# Patient Record
Sex: Male | Born: 1975 | Race: White | Hispanic: No | Marital: Single | Smoking: Former smoker
Health system: Southern US, Community
[De-identification: ages and names within clinical notes are randomized; demographics above are authoritative.]

## PROBLEM LIST (undated history)

## (undated) DIAGNOSIS — T148XXA Other injury of unspecified body region, initial encounter: Secondary | ICD-10-CM

## (undated) DIAGNOSIS — I509 Heart failure, unspecified: Secondary | ICD-10-CM

## (undated) DIAGNOSIS — I499 Cardiac arrhythmia, unspecified: Secondary | ICD-10-CM

## (undated) DIAGNOSIS — I10 Essential (primary) hypertension: Secondary | ICD-10-CM

## (undated) DIAGNOSIS — I469 Cardiac arrest, cause unspecified: Secondary | ICD-10-CM

## (undated) DIAGNOSIS — F1010 Alcohol abuse, uncomplicated: Secondary | ICD-10-CM

## (undated) HISTORY — DX: Essential (primary) hypertension: I10

## (undated) HISTORY — DX: Cardiac arrhythmia, unspecified: I49.9

## (undated) HISTORY — DX: Heart failure, unspecified: I50.9

## (undated) HISTORY — PX: FRACTURE SURGERY: SHX138

---

## 2019-03-30 ENCOUNTER — Ambulatory Visit: Payer: BLUE CROSS/BLUE SHIELD | Attending: Internal Medicine

## 2019-03-30 DIAGNOSIS — Z23 Encounter for immunization: Secondary | ICD-10-CM

## 2019-03-30 NOTE — Progress Notes (Signed)
   Covid-19 Vaccination Clinic  Name:  Samuel [name]    MRN: [id number] DOB: 08-May-1975  03/30/2019  [name] was observed post Covid-19 immunization for 15 minutes without incident. He was provided with Vaccine Information Sheet and instruction to access the V-Safe system.   [name] was instructed to call 911 with any severe reactions post vaccine: Marland Kitchen Difficulty breathing  . Swelling of face and throat  . A fast heartbeat  . A bad rash all over body  . Dizziness and weakness   Immunizations Administered    Name Date Dose VIS Date Route   Pfizer COVID-19 Vaccine 03/30/2019 11:45 AM 0.3 mL 12/29/2018 Intramuscular   Manufacturer: [employer], [employer]   Lot: [id number]   NDC: [address]-4

## 2019-04-25 ENCOUNTER — Ambulatory Visit: Payer: BLUE CROSS/BLUE SHIELD | Attending: Internal Medicine

## 2019-04-25 DIAGNOSIS — Z23 Encounter for immunization: Secondary | ICD-10-CM

## 2019-04-25 NOTE — Progress Notes (Signed)
   Covid-19 Vaccination Clinic  Name:  Samuel [name]    MRN: [id number] DOB: [birth year]  04/25/2019  [name] was observed post Covid-19 immunization for 15 minutes without incident. He was provided with Vaccine Information Sheet and instruction to access the V-Safe system.   [name] was instructed to call 911 with any severe reactions post vaccine: Marland Kitchen Difficulty breathing  . Swelling of face and throat  . A fast heartbeat  . A bad rash all over body  . Dizziness and weakness   Immunizations Administered    Name Date Dose VIS Date Route   Pfizer COVID-19 Vaccine 04/25/2019  2:45 PM 0.3 mL 12/29/2018 Intramuscular   Manufacturer: [employer], [employer]   Lot: [id number]   NDC: [address]-7

## 2020-05-13 ENCOUNTER — Ambulatory Visit: Payer: Self-pay

## 2020-05-13 ENCOUNTER — Telehealth: Payer: BLUE CROSS/BLUE SHIELD | Admitting: Nurse Practitioner

## 2020-05-13 DIAGNOSIS — J219 Acute bronchiolitis, unspecified: Secondary | ICD-10-CM

## 2020-05-13 MED ORDER — AZITHROMYCIN 250 MG PO TABS: ORAL_TABLET | ORAL | 0 refills | Status: DC

## 2020-05-13 MED ORDER — BENZONATATE 100 MG PO CAPS: 100.0000 mg | ORAL_CAPSULE | Freq: Three times a day (TID) | ORAL | 0 refills | Status: DC | PRN

## 2020-05-13 NOTE — Progress Notes (Signed)
We are sorry that you are not feeling well.  Here is how we plan to help!  Based on your presentation I believe you most likely have A cough due to bacteria.  When patients have a fever and a productive cough with a change in color or increased sputum production, we are concerned about bacterial bronchitis.  If left untreated it can progress to pneumonia.  If your symptoms do not improve with your treatment plan it is important that you contact your provider.   I have prescribed Azithromyin 250 mg: two tablets now and then one tablet daily for 4 additonal days    In addition you may use A prescription cough medication called Tessalon Perles 100mg . You may take 1-2 capsules every 8 hours as needed for your cough.    From your responses in the eVisit questionnaire you describe inflammation in the upper respiratory tract which is causing a significant cough.  This is commonly called Bronchitis and has four common causes:    Allergies  Viral Infections  Acid Reflux  Bacterial Infection Allergies, viruses and acid reflux are treated by controlling symptoms or eliminating the cause.  Another example might be a cough caused by acid reflux. Controlling the reflux helps control the cough.  As we discussed the number for setting up a primary care doctor is:  [phone] You can also make an appointment by visiting their website [facility]  It is very important to establish with a primary care doctor and to have regular physical exams. If you have any difficulty setting up an appointment please let [facility] know.   HOME CARE . Only take medications as instructed by your medical team. . Complete the entire course of an antibiotic. . Drink plenty of fluids and get plenty of rest. . Avoid close contacts especially the very young and the elderly . Cover your mouth if you cough or cough into your sleeve. . Always remember to wash your hands . A steam or ultrasonic humidifier can  help congestion.   GET HELP RIGHT AWAY IF: . You develop worsening fever. . You become short of breath . You cough up blood. . Your symptoms persist after you have completed your treatment plan MAKE SURE YOU   Understand these instructions.  Will watch your condition.  Will get help right away if you are not doing well or get worse.  Your e-visit answers were reviewed by a board certified advanced clinical practitioner to complete your personal care plan.  Depending on the condition, your plan could have included both over the counter or prescription medications. If there is a problem please reply  once you have received a response from your provider. Your safety is important to [place].  If you have drug allergies check your prescription carefully.    You can use MyChart to ask questions about today's visit, request a non-urgent call back, or ask for a work or school excuse for 24 hours related to this e-Visit. If it has been greater than 24 hours you will need to follow up with your provider, or enter a new e-Visit to address those concerns. You will get an e-mail in the next two days asking about your experience.  I hope that your e-visit has been valuable and will speed your recovery. Thank you for using e-visits.   10 minutes were spent reviewing patient's symptoms, spoke with patient over the phone to gather additional information and to assure stability of patient. No acute distress  was present during phone conversation with provider. Patient denied SOB, and was instructed to seek medical attention with any new/worsening symptoms.   Meds ordered this encounter  Medications  . azithromycin (ZITHROMAX) 250 MG tablet    Sig: Take 2 tablets on day 1, then 1 tablet daily on days 2 through 5    Dispense:  6 tablet    Refill:  0  . benzonatate (TESSALON) 100 MG capsule    Sig: Take 1 capsule (100 mg total) by mouth 3 (three) times daily as needed for cough.    Dispense:  30 capsule     Refill:  0

## 2020-05-15 ENCOUNTER — Ambulatory Visit: Payer: Self-pay

## 2020-05-16 ENCOUNTER — Emergency Department: Payer: BLUE CROSS/BLUE SHIELD

## 2020-05-16 ENCOUNTER — Inpatient Hospital Stay
Admission: EM | Admit: 2020-05-16 | Discharge: 2020-05-22 | DRG: 286 | Disposition: A | Discharge status: Home / Self Care | Payer: BLUE CROSS/BLUE SHIELD | Attending: Internal Medicine | Admitting: Internal Medicine

## 2020-05-16 ENCOUNTER — Other Ambulatory Visit: Payer: Self-pay

## 2020-05-16 DIAGNOSIS — I5021 Acute systolic (congestive) heart failure: Secondary | ICD-10-CM | POA: Diagnosis not present

## 2020-05-16 DIAGNOSIS — I2510 Atherosclerotic heart disease of native coronary artery without angina pectoris: Secondary | ICD-10-CM | POA: Diagnosis not present

## 2020-05-16 DIAGNOSIS — G8929 Other chronic pain: Secondary | ICD-10-CM | POA: Diagnosis present

## 2020-05-16 DIAGNOSIS — Z791 Long term (current) use of non-steroidal anti-inflammatories (NSAID): Secondary | ICD-10-CM | POA: Diagnosis not present

## 2020-05-16 DIAGNOSIS — Z87891 Personal history of nicotine dependence: Secondary | ICD-10-CM | POA: Diagnosis not present

## 2020-05-16 DIAGNOSIS — F1020 Alcohol dependence, uncomplicated: Secondary | ICD-10-CM

## 2020-05-16 DIAGNOSIS — Z79899 Other long term (current) drug therapy: Secondary | ICD-10-CM | POA: Diagnosis not present

## 2020-05-16 DIAGNOSIS — Z809 Family history of malignant neoplasm, unspecified: Secondary | ICD-10-CM | POA: Diagnosis not present

## 2020-05-16 DIAGNOSIS — I110 Hypertensive heart disease with heart failure: Secondary | ICD-10-CM | POA: Diagnosis present

## 2020-05-16 DIAGNOSIS — E739 Lactose intolerance, unspecified: Secondary | ICD-10-CM | POA: Diagnosis present

## 2020-05-16 DIAGNOSIS — Z720 Tobacco use: Secondary | ICD-10-CM | POA: Diagnosis not present

## 2020-05-16 DIAGNOSIS — Z8674 Personal history of sudden cardiac arrest: Secondary | ICD-10-CM | POA: Diagnosis not present

## 2020-05-16 DIAGNOSIS — I4891 Unspecified atrial fibrillation: Secondary | ICD-10-CM | POA: Diagnosis not present

## 2020-05-16 DIAGNOSIS — I160 Hypertensive urgency: Secondary | ICD-10-CM | POA: Diagnosis present

## 2020-05-16 DIAGNOSIS — K760 Fatty (change of) liver, not elsewhere classified: Secondary | ICD-10-CM | POA: Diagnosis present

## 2020-05-16 DIAGNOSIS — I4819 Other persistent atrial fibrillation: Principal | ICD-10-CM | POA: Diagnosis present

## 2020-05-16 DIAGNOSIS — K8020 Calculus of gallbladder without cholecystitis without obstruction: Secondary | ICD-10-CM | POA: Diagnosis present

## 2020-05-16 DIAGNOSIS — I420 Dilated cardiomyopathy: Secondary | ICD-10-CM | POA: Diagnosis not present

## 2020-05-16 DIAGNOSIS — F1210 Cannabis abuse, uncomplicated: Secondary | ICD-10-CM | POA: Diagnosis present

## 2020-05-16 DIAGNOSIS — Z8249 Family history of ischemic heart disease and other diseases of the circulatory system: Secondary | ICD-10-CM

## 2020-05-16 DIAGNOSIS — D72829 Elevated white blood cell count, unspecified: Secondary | ICD-10-CM | POA: Diagnosis present

## 2020-05-16 DIAGNOSIS — Z83438 Family history of other disorder of lipoprotein metabolism and other lipidemia: Secondary | ICD-10-CM | POA: Diagnosis not present

## 2020-05-16 DIAGNOSIS — I428 Other cardiomyopathies: Secondary | ICD-10-CM | POA: Diagnosis present

## 2020-05-16 DIAGNOSIS — Z6841 Body Mass Index (BMI) 40.0 and over, adult: Secondary | ICD-10-CM | POA: Diagnosis not present

## 2020-05-16 DIAGNOSIS — Z20822 Contact with and (suspected) exposure to covid-19: Secondary | ICD-10-CM | POA: Diagnosis present

## 2020-05-16 DIAGNOSIS — I429 Cardiomyopathy, unspecified: Secondary | ICD-10-CM

## 2020-05-16 DIAGNOSIS — R1013 Epigastric pain: Secondary | ICD-10-CM | POA: Diagnosis not present

## 2020-05-16 DIAGNOSIS — R7401 Elevation of levels of liver transaminase levels: Secondary | ICD-10-CM | POA: Diagnosis present

## 2020-05-16 DIAGNOSIS — I340 Nonrheumatic mitral (valve) insufficiency: Secondary | ICD-10-CM | POA: Diagnosis present

## 2020-05-16 DIAGNOSIS — I25111 Atherosclerotic heart disease of native coronary artery with angina pectoris with documented spasm: Secondary | ICD-10-CM | POA: Diagnosis present

## 2020-05-16 DIAGNOSIS — I493 Ventricular premature depolarization: Secondary | ICD-10-CM | POA: Diagnosis present

## 2020-05-16 HISTORY — DX: Cardiac arrest, cause unspecified: I46.9

## 2020-05-16 HISTORY — DX: Morbid (severe) obesity due to excess calories: E66.01

## 2020-05-16 HISTORY — DX: Alcohol abuse, uncomplicated: F10.10

## 2020-05-16 HISTORY — DX: Other injury of unspecified body region, initial encounter: T14.8XXA

## 2020-05-16 LAB — COMPREHENSIVE METABOLIC PANEL
ALT: 119 U/L — ABNORMAL HIGH (ref 0–44)
AST: 60 U/L — ABNORMAL HIGH (ref 15–41)
Albumin: 4.6 g/dL (ref 3.5–5.0)
Alkaline Phosphatase: 57 U/L (ref 38–126)
Anion gap: 11 (ref 5–15)
BUN: 17 mg/dL (ref 6–20)
CO2: 22 mmol/L (ref 22–32)
Calcium: 9 mg/dL (ref 8.9–10.3)
Chloride: 106 mmol/L (ref 98–111)
Creatinine, Ser: 1.11 mg/dL (ref 0.61–1.24)
GFR, Estimated: >60 mL/min (ref >60)
Glucose, Bld: 114 mg/dL — ABNORMAL HIGH (ref 70–99)
Potassium: 3.8 mmol/L (ref 3.5–5.1)
Sodium: 139 mmol/L (ref 135–145)
Total Bilirubin: 1.7 mg/dL — ABNORMAL HIGH (ref 0.3–1.2)
Total Protein: 8 g/dL (ref 6.5–8.1)

## 2020-05-16 LAB — TROPONIN I (HIGH SENSITIVITY): Troponin I (High Sensitivity): 16 ng/L (ref <18)

## 2020-05-16 LAB — URINALYSIS, COMPLETE (UACMP) WITH MICROSCOPIC
Bacteria, UA: NONE SEEN
Bilirubin Urine: NEGATIVE
Glucose, UA: NEGATIVE mg/dL
Ketones, ur: 5 mg/dL — AB
Leukocytes,Ua: NEGATIVE
Nitrite: NEGATIVE
Protein, ur: NEGATIVE mg/dL
Specific Gravity, Urine: 1.028 (ref 1.005–1.030)
Squamous Epithelial / HPF: NONE SEEN (ref 0–5)
pH: 5 (ref 5.0–8.0)

## 2020-05-16 LAB — PROTIME-INR
INR: 1.1 (ref 0.8–1.2)
Prothrombin Time: 14.1 seconds (ref 11.4–15.2)

## 2020-05-16 LAB — CBC
HCT: 43.1 % (ref 39.0–52.0)
Hemoglobin: 14.6 g/dL (ref 13.0–17.0)
MCH: 32.4 pg (ref 26.0–34.0)
MCHC: 33.9 g/dL (ref 30.0–36.0)
MCV: 95.8 fL (ref 80.0–100.0)
Platelets: 351 10*3/uL (ref 150–400)
RBC: 4.5 MIL/uL (ref 4.22–5.81)
RDW: 13.2 % (ref 11.5–15.5)
WBC: 12.6 10*3/uL — ABNORMAL HIGH (ref 4.0–10.5)
nRBC: 0 % (ref 0.0–0.2)

## 2020-05-16 LAB — LIPASE, BLOOD: Lipase: 28 U/L (ref 11–51)

## 2020-05-16 LAB — APTT: aPTT: 34 seconds (ref 24–36)

## 2020-05-16 LAB — RESP PANEL BY RT-PCR (FLU A&B, COVID) ARPGX2
Influenza A by PCR: NEGATIVE
Influenza B by PCR: NEGATIVE
SARS Coronavirus 2 by RT PCR: NEGATIVE

## 2020-05-16 MED ORDER — ACETAMINOPHEN 325 MG PO TABS: 650.0000 mg | ORAL_TABLET | Freq: Four times a day (QID) | ORAL | Status: DC | PRN

## 2020-05-16 MED ORDER — PANTOPRAZOLE SODIUM 40 MG PO TBEC
40.0000 mg | DELAYED_RELEASE_TABLET | Freq: Every day | ORAL | Status: DC
  Administered 2020-05-17 – 2020-05-22 (×7): 40 mg via ORAL
  Filled 2020-05-16 (×7): qty 1

## 2020-05-16 MED ORDER — DILTIAZEM HCL 25 MG/5ML IV SOLN
10.0000 mg | Freq: Once | INTRAVENOUS | Status: AC
  Administered 2020-05-16: 10 mg via INTRAVENOUS
  Filled 2020-05-16: qty 5

## 2020-05-16 MED ORDER — DILTIAZEM HCL-DEXTROSE 125-5 MG/125ML-% IV SOLN (PREMIX)
5.0000 mg/h | INTRAVENOUS | Status: DC
Start: 2020-05-16 — End: 2020-05-16
  Administered 2020-05-16: 5 mg/h via INTRAVENOUS
  Filled 2020-05-16: qty 125

## 2020-05-16 MED ORDER — SODIUM CHLORIDE 0.9 % IV BOLUS
1000.0000 mL | Freq: Once | INTRAVENOUS | Status: AC
  Administered 2020-05-16: 1000 mL via INTRAVENOUS

## 2020-05-16 MED ORDER — HEPARIN (PORCINE) 25000 UT/250ML-% IV SOLN
1500.0000 [IU]/h | INTRAVENOUS | Status: DC
  Filled 2020-05-16: qty 250

## 2020-05-16 MED ORDER — SUCRALFATE 1 G PO TABS
1.0000 g | ORAL_TABLET | Freq: Three times a day (TID) | ORAL | Status: DC
  Administered 2020-05-17 – 2020-05-22 (×18): 1 g via ORAL
  Filled 2020-05-16 (×18): qty 1

## 2020-05-16 MED ORDER — DILTIAZEM HCL-DEXTROSE 125-5 MG/125ML-% IV SOLN (PREMIX)
5.0000 mg/h | INTRAVENOUS | Status: DC
  Administered 2020-05-17 – 2020-05-18 (×4): 15 mg/h via INTRAVENOUS
  Filled 2020-05-16 (×4): qty 125

## 2020-05-16 MED ORDER — ONDANSETRON HCL 4 MG/2ML IJ SOLN: 4.0000 mg | Freq: Four times a day (QID) | INTRAMUSCULAR | Status: DC | PRN

## 2020-05-16 MED ORDER — TRAMADOL HCL 50 MG PO TABS
50.0000 mg | ORAL_TABLET | Freq: Four times a day (QID) | ORAL | Status: DC | PRN
Start: 2020-05-16 — End: 2020-05-22
  Filled 2020-05-16: qty 1

## 2020-05-16 MED ORDER — IOHEXOL 300 MG/ML  SOLN
125.0000 mL | Freq: Once | INTRAMUSCULAR | Status: AC | PRN
  Administered 2020-05-16: 125 mL via INTRAVENOUS

## 2020-05-16 MED ORDER — ENOXAPARIN SODIUM 80 MG/0.8ML IJ SOSY
0.5000 mg/kg | PREFILLED_SYRINGE | INTRAMUSCULAR | Status: DC
  Administered 2020-05-17: 70 mg via SUBCUTANEOUS
  Filled 2020-05-16: qty 0.7

## 2020-05-16 MED ORDER — ACETAMINOPHEN 325 MG PO TABS: 650.0000 mg | ORAL_TABLET | ORAL | Status: DC | PRN

## 2020-05-16 MED ORDER — ENOXAPARIN SODIUM 40 MG/0.4ML IJ SOSY: 40.0000 mg | PREFILLED_SYRINGE | INTRAMUSCULAR | Status: DC

## 2020-05-16 MED ORDER — HEPARIN SODIUM (PORCINE) 5000 UNIT/ML IJ SOLN: 4000.0000 [IU] | Freq: Once | INTRAMUSCULAR | Status: DC

## 2020-05-16 MED ORDER — IOHEXOL 9 MG/ML PO SOLN
500.0000 mL | ORAL | Status: AC
  Administered 2020-05-16: 500 mL via ORAL

## 2020-05-16 MED ORDER — MORPHINE SULFATE (PF) 2 MG/ML IV SOLN: 2.0000 mg | INTRAVENOUS | Status: DC | PRN

## 2020-05-16 MED ORDER — HEPARIN BOLUS VIA INFUSION
4000.0000 [IU] | Freq: Once | INTRAVENOUS | Status: DC
  Filled 2020-05-16: qty 4000
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
## 2020-05-16 NOTE — ED Triage Notes (Signed)
Pt c/o epigastric pain with SOB and diarrhea for the past week, states he went to GI specialist today and was referred to the ED for systolic pressure of 170. Pt is in NAD at present.

## 2020-05-16 NOTE — ED Notes (Signed)
Pt back from CT, HR remains 150s-160s, MD Paduchowski made aware via secure chat. Awaiting CT results. Report given to [facility]: Lambert Rhonda W An, Charity fundraiser.

## 2020-05-16 NOTE — Progress Notes (Signed)
PHARMACIST - PHYSICIAN COMMUNICATION  CONCERNING:  Enoxaparin (Lovenox) for DVT Prophylaxis    RECOMMENDATION: Patient was prescribed enoxaprin 40mg  q24 hours for VTE prophylaxis.   Filed Weights   05/16/20 1800  Weight: (!) 137.9 kg (304 lb)    Body mass index is 42.4 kg/m.  Estimated Creatinine Clearance: 119.2 mL/min (by C-G formula based on SCr of 1.11 mg/dL).   Based on [facility] policy patient is candidate for enoxaparin 0.5mg /kg TBW SQ every 24 hours based on BMI being >30.   DESCRIPTION: Pharmacy has adjusted enoxaparin dose per [facility] policy.  Patient is now receiving enoxaparin 70 mg every 24 hours    [facility], PharmD Clinical Pharmacist  05/16/2020 9:00 PM
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
## 2020-05-16 NOTE — ED Notes (Signed)
Assumed care of pt, pt in afib RVR. Reports chest pressure but able to talk in full sentences with regular and unlabored breathing. Denies lightheaded or dizziness. PO contrast finished and CT at bedside to transport to imaging. Pt father at bedside. AO x4. Awaiting pharmacy to tube heparin as pyxis is out. Pt aware of plan of care.

## 2020-05-17 ENCOUNTER — Inpatient Hospital Stay
Admit: 2020-05-17 | Discharge: 2020-05-17 | Disposition: A | Discharge status: Home / Self Care | Payer: BLUE CROSS/BLUE SHIELD | Attending: Internal Medicine | Admitting: Internal Medicine

## 2020-05-17 ENCOUNTER — Encounter: Payer: Self-pay | Admitting: Internal Medicine

## 2020-05-17 DIAGNOSIS — I429 Cardiomyopathy, unspecified: Secondary | ICD-10-CM

## 2020-05-17 DIAGNOSIS — I4891 Unspecified atrial fibrillation: Secondary | ICD-10-CM | POA: Diagnosis not present

## 2020-05-17 DIAGNOSIS — I5021 Acute systolic (congestive) heart failure: Secondary | ICD-10-CM | POA: Diagnosis not present

## 2020-05-17 DIAGNOSIS — Z720 Tobacco use: Secondary | ICD-10-CM

## 2020-05-17 DIAGNOSIS — R1013 Epigastric pain: Secondary | ICD-10-CM

## 2020-05-17 DIAGNOSIS — F1020 Alcohol dependence, uncomplicated: Secondary | ICD-10-CM | POA: Diagnosis not present

## 2020-05-17 LAB — HEPARIN LEVEL (UNFRACTIONATED): Heparin Unfractionated: 0.12 IU/mL — ABNORMAL LOW (ref 0.30–0.70)

## 2020-05-17 LAB — ECHOCARDIOGRAM COMPLETE
AR max vel: 2.5 cm2
AV Peak grad: 5.7 mmHg
Ao pk vel: 1.19 m/s
Height: 71 in
S' Lateral: 5.45 cm
Weight: 4864 oz

## 2020-05-17 LAB — MAGNESIUM: Magnesium: 2.3 mg/dL (ref 1.7–2.4)

## 2020-05-17 MED ORDER — HEPARIN (PORCINE) 25000 UT/250ML-% IV SOLN
2800.0000 [IU]/h | INTRAVENOUS | Status: DC
  Administered 2020-05-17: 1800 [IU]/h via INTRAVENOUS
  Administered 2020-05-17: 1500 [IU]/h via INTRAVENOUS
  Administered 2020-05-18 (×2): 2400 [IU]/h via INTRAVENOUS
  Administered 2020-05-19 – 2020-05-20 (×3): 2800 [IU]/h via INTRAVENOUS
  Filled 2020-05-17 (×7): qty 250

## 2020-05-17 MED ORDER — METOPROLOL TARTRATE 25 MG PO TABS
25.0000 mg | ORAL_TABLET | Freq: Two times a day (BID) | ORAL | Status: DC
  Administered 2020-05-17: 25 mg via ORAL
  Filled 2020-05-17: qty 1

## 2020-05-17 MED ORDER — HEPARIN BOLUS VIA INFUSION
4000.0000 [IU] | Freq: Once | INTRAVENOUS | Status: AC
  Administered 2020-05-17: 4000 [IU] via INTRAVENOUS
  Filled 2020-05-17: qty 4000

## 2020-05-17 MED ORDER — HEPARIN BOLUS VIA INFUSION
3200.0000 [IU] | Freq: Once | INTRAVENOUS | Status: AC
  Administered 2020-05-17: 3200 [IU] via INTRAVENOUS
  Filled 2020-05-17: qty 3200

## 2020-05-17 MED ORDER — FUROSEMIDE 10 MG/ML IJ SOLN
60.0000 mg | Freq: Two times a day (BID) | INTRAMUSCULAR | Status: DC
  Administered 2020-05-17 – 2020-05-18 (×2): 60 mg via INTRAVENOUS
  Filled 2020-05-17: qty 8
  Filled 2020-05-17: qty 6

## 2020-05-17 MED ORDER — METOPROLOL TARTRATE 25 MG PO TABS
25.0000 mg | ORAL_TABLET | Freq: Four times a day (QID) | ORAL | Status: DC
  Administered 2020-05-17 – 2020-05-18 (×3): 25 mg via ORAL
  Filled 2020-05-17 (×3): qty 1
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
## 2020-05-17 NOTE — Progress Notes (Addendum)
Progress Note    Samuel [name]  ZOX:[id number] DOB: [birth year]  DOA: 05/16/2020 PCP: Pcp, No      Brief Narrative:    Medical records reviewed and are as summarized below:  Samuel [name] is a 45 y.o. male with medical history significant for morbid obesity, alcohol use disorder, was referred from the gastroenterology office because of rapid heart rate and irregular heart rhythm.  He complained of epigastric pain, abdominal distention, palpitations and shortness of breath.  He was found to have hypertension and atrial fibrillation with RVR with heart rate in the 160s.      Assessment/Plan:   Principal Problem:   Rapid atrial fibrillation, new onset (HCC) Active Problems:   Alcohol use disorder, moderate, dependence (HCC)   Obesity, Class III, BMI 40-49.9 (morbid obesity) (HCC)   Cholelithiasis without obstruction   Epigastric pain   NSAID long-term use   Body mass index is 42.4 kg/m.  (Morbid obesity)   Atrial fibrillation with RVR: Taper off IV Cardizem infusion.  Continue IV heparin infusion.  Monitor heparin level per protocol.  Plan for TEE and cardioversion.  Follow-up with cardiologist.  Cardiomyopathy: Specifics unknown.  2D echo showed EF of 25%.  Alcohol and tobacco use disorder: Advised to quit drinking alcohol and to stop smoking cigarettes.  Hypertension: Discussed the need for antihypertensives on a long-term basis.  Cholelithiasis, hepatic steatosis, elevated liver enzymes: Outpatient follow-up   Diet Order            Diet regular Room service appropriate? Yes; Fluid consistency: Thin  Diet effective now                    Consultants:  Cardiologist  Procedures:  None    Medications:   . furosemide  60 mg Intravenous BID  . metoprolol tartrate  25 mg Oral Q6H  . pantoprazole  40 mg Oral Daily  . sucralfate  1 g Oral TID WC & HS   Continuous Infusions: . diltiazem (CARDIZEM) infusion 15 mg/hr  (05/17/20 1544)  . heparin 1,500 Units/hr (05/17/20 1034)     Anti-infectives (From admission, onward)   None             Family Communication/Anticipated D/C date and plan/Code Status   DVT prophylaxis:      Code Status: Full Code  Family Communication: Father at the bedside Disposition Plan:    Status is: Inpatient  Remains inpatient appropriate because:IV treatments appropriate due to intensity of illness or inability to take PO   Dispo: The patient is from: Home              Anticipated d/c is to: Home              Patient currently is not medically stable to d/c.   Difficult to place patient No           Subjective:   C/o lower mid chest pain/epigastric pain.  Breathing is a little better.  Objective:    Vitals:   05/17/20 1130 05/17/20 1327 05/17/20 1330 05/17/20 1544  BP: (!) 141/83 (!) 155/110 (!) 119/104 129/83  Pulse: 68 (!) 113 (!) 53 97  Resp: (!) 30  (!) 23 (!) 21  Temp:    97.8 F (36.6 C)  TempSrc:      SpO2: 96%  96% 93%  Weight:      Height:       No data found.   Intake/Output  Summary (Last 24 hours) at 05/17/2020 1630 Last data filed at 05/17/2020 1039 Gross per 24 hour  Intake 1000 ml  Output 800 ml  Net 200 ml   Filed Weights   05/16/20 1800  Weight: (!) 137.9 kg    Exam:  GEN: NAD SKIN: No rash EYES: EOMI ENT: MMM CV: Irregular rate and rhythm, tachycardic PULM: CTA B ABD: soft, obese, NT, +BS CNS: AAO x 3, non focal EXT: No edema or tenderness        Data Reviewed:   I have personally reviewed following labs and imaging studies:  Labs: Labs show the following:   Basic Metabolic Panel: Recent Labs  Lab 05/16/20 1522  NA 139  K 3.8  CL 106  CO2 22  GLUCOSE 114*  BUN 17  CREATININE 1.11  CALCIUM 9.0   GFR Estimated Creatinine Clearance: 119.2 mL/min (by C-G formula based on SCr of 1.11 mg/dL). Liver Function Tests: Recent Labs  Lab 05/16/20 1522  AST 60*  ALT 119*  ALKPHOS  57  BILITOT 1.7*  PROT 8.0  ALBUMIN 4.6   Recent Labs  Lab 05/16/20 1522  LIPASE 28   No results for input(s): AMMONIA in the last 168 hours. Coagulation profile Recent Labs  Lab 05/16/20 1826  INR 1.1    CBC: Recent Labs  Lab 05/16/20 1522  WBC 12.6*  HGB 14.6  HCT 43.1  MCV 95.8  PLT 351   Cardiac Enzymes: No results for input(s): CKTOTAL, CKMB, CKMBINDEX, TROPONINI in the last 168 hours. BNP (last 3 results) No results for input(s): PROBNP in the last 8760 hours. CBG: No results for input(s): GLUCAP in the last 168 hours. D-Dimer: No results for input(s): DDIMER in the last 72 hours. Hgb A1c: No results for input(s): HGBA1C in the last 72 hours. Lipid Profile: No results for input(s): CHOL, HDL, LDLCALC, TRIG, CHOLHDL, LDLDIRECT in the last 72 hours. Thyroid function studies: No results for input(s): TSH, T4TOTAL, T3FREE, THYROIDAB in the last 72 hours.  Invalid input(s): FREET3 Anemia work up: No results for input(s): VITAMINB12, FOLATE, FERRITIN, TIBC, IRON, RETICCTPCT in the last 72 hours. Sepsis Labs: Recent Labs  Lab 05/16/20 1522  WBC 12.6*    Microbiology Recent Results (from the past 240 hour(s))  Resp Panel by RT-PCR (Flu A&B, Covid) Nasopharyngeal Swab     Status: None   Collection Time: 05/16/20  6:26 PM   Specimen: Nasopharyngeal Swab; Nasopharyngeal(NP) swabs in vial transport medium  Result Value Ref Range Status   SARS Coronavirus 2 by RT PCR NEGATIVE NEGATIVE Final    Comment: (NOTE) SARS-CoV-2 target nucleic acids are NOT DETECTED.  The SARS-CoV-2 RNA is generally detectable in upper respiratory specimens during the acute phase of infection. The lowest concentration of SARS-CoV-2 viral copies this assay can detect is 138 copies/mL. A negative result does not preclude SARS-Cov-2 infection and should not be used as the sole basis for treatment or other patient management decisions. A negative result may occur with  improper  specimen collection/handling, submission of specimen other than nasopharyngeal swab, presence of viral mutation(s) within the areas targeted by this assay, and inadequate number of viral copies(<138 copies/mL). A negative result must be combined with clinical observations, patient history, and epidemiological information. The expected result is Negative.  Fact Sheet for Patients:  BloggerCourse.com  Fact Sheet for Healthcare Providers:  SeriousBroker.it  This test is no t yet approved or cleared by the [place] FDA and  has been authorized for detection and/or  diagnosis of SARS-CoV-2 by FDA under an Emergency Use Authorization (EUA). This EUA will remain  in effect (meaning this test can be used) for the duration of the COVID-19 declaration under Section 564(b)(1) of the Act, 21 U.S.C.section 360bbb-3(b)(1), unless the authorization is terminated  or revoked sooner.       Influenza A by PCR NEGATIVE NEGATIVE Final   Influenza B by PCR NEGATIVE NEGATIVE Final    Comment: (NOTE) The Xpert Xpress SARS-CoV-2/FLU/RSV plus assay is intended as an aid in the diagnosis of influenza from Nasopharyngeal swab specimens and should not be used as a sole basis for treatment. Nasal washings and aspirates are unacceptable for Xpert Xpress SARS-CoV-2/FLU/RSV testing.  Fact Sheet for Patients: BloggerCourse.com  Fact Sheet for Healthcare Providers: SeriousBroker.it  This test is not yet approved or cleared by the [place] FDA and has been authorized for detection and/or diagnosis of SARS-CoV-2 by FDA under an Emergency Use Authorization (EUA). This EUA will remain in effect (meaning this test can be used) for the duration of the COVID-19 declaration under Section 564(b)(1) of the Act, 21 U.S.C. section 360bbb-3(b)(1), unless the authorization is terminated or revoked.  Performed at  [facility], [address] [place], [place] [address]     Procedures and diagnostic studies:  CT ABDOMEN PELVIS W CONTRAST  Result Date: 05/16/2020 CLINICAL DATA:  Nonlocalized acute abdominal pain. EXAM: CT ABDOMEN AND PELVIS WITH CONTRAST TECHNIQUE: Multidetector CT imaging of the abdomen and pelvis was performed using the standard protocol following bolus administration of intravenous contrast. CONTRAST:  OMNIPAQUE IOHEXOL 300 MG/ML  SOLN COMPARISON:  None. FINDINGS: Lower chest: Trace right pleural effusion. Hepatobiliary: The hepatic parenchyma is diffusely mildly hypodense compared to the splenic parenchyma consistent with fatty infiltration. Calcified gallstone noted within the gallbladder lumen. No gallbladder wall thickening or pericholecystic fluid. No biliary dilatation. Pancreas: Diffusely atrophic. No focal lesion. Otherwise normal pancreatic contour. No surrounding inflammatory changes. No main pancreatic ductal dilatation. Spleen: Normal in size without focal abnormality. Adrenals/Urinary Tract: No adrenal nodule bilaterally. Bilateral kidneys enhance symmetrically. No hydronephrosis. No hydroureter. The urinary bladder is unremarkable. On delayed imaging, there is no urothelial wall thickening and there are no filling defects in the opacified portions of the bilateral collecting systems or ureters. Stomach/Bowel: PO contrast reaches the transverse colon. Stomach is within normal limits. No evidence of bowel wall thickening or dilatation. Appendix appears normal. Vascular/Lymphatic: No abdominal aorta or iliac aneurysm. Mild atherosclerotic plaque of the aorta and its branches. No abdominal, pelvic, or inguinal lymphadenopathy. Reproductive: Prostate is unremarkable. Other: No intraperitoneal free fluid. No intraperitoneal free gas. No organized fluid collection. Musculoskeletal: No acute or significant osseous findings. IMPRESSION: 1. Trace right pleural effusion 2.  Cholelithiasis with no CT findings of acute cholecystitis tightest choledocholithiasis. 3. Mild hepatic steatosis. 4.  Aortic Atherosclerosis (ICD10-I70.0). Electronically Signed   By: Tish Frederickson M.D.   On: 05/16/2020 19:50   DG Chest Portable 1 View  Result Date: 05/16/2020 CLINICAL DATA:  Shortness of breath. EXAM: PORTABLE CHEST 1 VIEW COMPARISON:  None. FINDINGS: The heart size appears to be generous. There are prominent interstitial lung markings bilaterally with blunting of the costophrenic angles bilaterally. There are streaky bibasilar airspace opacities suggestive of atelectasis. There is no pneumothorax. There is an old healed right clavicle fracture. IMPRESSION: Cardiomegaly with findings consistent with volume overload and possible developing interstitial edema. An atypical infectious process seems less likely but is not entirely excluded. Electronically Signed   By: Cristal Deer  Green M.D.   On: 05/16/2020 18:28   ECHOCARDIOGRAM COMPLETE  Result Date: 05/17/2020    ECHOCARDIOGRAM REPORT   Patient Name:   Samuel Maidens [name] Date of Exam: 05/17/2020 Medical Rec #:  [id number]                 Height:       71.0 in Accession #:    [id number]                Weight:       304.0 lb Date of Birth:  07/07/1975                 BSA:          2.519 m Patient Age:    44 years                  BP:           144/115 mmHg Patient Gender: M                         HR:           71 bpm. Exam Location:  [facility] Procedure: 2D Echo, Cardiac Doppler and Color Doppler Indications:     Atrial Fibrillation I48.91  History:         Patient has no prior history of Echocardiogram examinations.  Sonographer:     Neysa Bonito Roar Referring Phys:  [id number] Andris Baumann Diagnosing Phys: Marcina Millard MD IMPRESSIONS  1. Left ventricular ejection fraction, by estimation, is 25 to 30%. The left ventricle has severely decreased function. The left ventricle has no regional wall motion abnormalities. The left ventricular  internal cavity size was mildly to moderately dilated. Left ventricular diastolic parameters are indeterminate.  2. Right ventricular systolic function is normal. The right ventricular size is normal.  3. The mitral valve is normal in structure. Mild to moderate mitral valve regurgitation. No evidence of mitral stenosis.  4. The aortic valve is normal in structure. Aortic valve regurgitation is not visualized. No aortic stenosis is present.  5. The inferior vena cava is normal in size with greater than 50% respiratory variability, suggesting right atrial pressure of 3 mmHg. FINDINGS  Left Ventricle: Left ventricular ejection fraction, by estimation, is 25 to 30%. The left ventricle has severely decreased function. The left ventricle has no regional wall motion abnormalities. The left ventricular internal cavity size was mildly to moderately dilated. There is no left ventricular hypertrophy. Left ventricular diastolic parameters are indeterminate. Right Ventricle: The right ventricular size is normal. No increase in right ventricular wall thickness. Right ventricular systolic function is normal. Left Atrium: Left atrial size was normal in size. Right Atrium: Right atrial size was normal in size. Pericardium: There is no evidence of pericardial effusion. Mitral Valve: The mitral valve is normal in structure. Mild to moderate mitral valve regurgitation. No evidence of mitral valve stenosis. Tricuspid Valve: The tricuspid valve is normal in structure. Tricuspid valve regurgitation is mild . No evidence of tricuspid stenosis. Aortic Valve: The aortic valve is normal in structure. Aortic valve regurgitation is not visualized. No aortic stenosis is present. Aortic valve peak gradient measures 5.7 mmHg. Pulmonic Valve: The pulmonic valve was normal in structure. Pulmonic valve regurgitation is not visualized. No evidence of pulmonic stenosis. Aorta: The aortic root is normal in size and structure. Venous: The inferior vena  cava is normal in size with greater than 50% respiratory variability,  suggesting right atrial pressure of 3 mmHg. IAS/Shunts: No atrial level shunt detected by color flow Doppler.  LEFT VENTRICLE PLAX 2D LVIDd:         6.03 cm  Diastology LVIDs:         5.45 cm  LV e' medial:  7.40 cm/s LV PW:         1.14 cm  LV e' lateral: 8.92 cm/s LV IVS:        0.99 cm LVOT diam:     2.00 cm LVOT Area:     3.14 cm  RIGHT VENTRICLE RV Mid diam:    2.68 cm RV S prime:     12.40 cm/s TAPSE (M-mode): 1.6 cm LEFT ATRIUM              Index       RIGHT ATRIUM           Index LA diam:        5.00 cm  1.98 cm/m  RA Area:     13.50 cm LA Vol (A2C):   85.3 ml  33.86 ml/m RA Volume:   27.40 ml  10.88 ml/m LA Vol (A4C):   115.0 ml 45.65 ml/m LA Biplane Vol: 102.0 ml 40.49 ml/m  AORTIC VALVE                PULMONIC VALVE AV Area (Vmax): 2.50 cm    PV Vmax:       0.95 m/s AV Vmax:        119.00 cm/s PV Peak grad:  3.6 mmHg AV Peak Grad:   5.7 mmHg LVOT Vmax:      94.70 cm/s  AORTA Ao Root diam: 2.70 cm TRICUSPID VALVE TR Peak grad:   20.2 mmHg TR Vmax:        225.00 cm/s  SHUNTS Systemic Diam: 2.00 cm Marcina Millard MD Electronically signed by Marcina Millard MD Signature Date/Time: 05/17/2020/2:27:44 PM    Final                LOS: 1 day   Alvenia Treese  [facility]   Pager on [email or web address]. If 7PM-7AM, please contact night-coverage at [email or web address]     05/17/2020, 4:30 PM
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
## 2020-05-17 NOTE — Progress Notes (Signed)
*  PRELIMINARY RESULTS* Echocardiogram 2D Echocardiogram has been performed.  Neita Garnet Tichina Koebel 05/17/2020, 2:04 PM

## 2020-05-17 NOTE — ED Notes (Signed)
Pt sleeping in NAD, on cardiac monitor, still on cardizem drip at 15 mg/hr, BP 122/76

## 2020-05-18 ENCOUNTER — Inpatient Hospital Stay
Admit: 2020-05-18 | Discharge: 2020-05-18 | Disposition: A | Discharge status: Home / Self Care | Payer: BLUE CROSS/BLUE SHIELD | Attending: Nurse Practitioner | Admitting: Nurse Practitioner

## 2020-05-18 DIAGNOSIS — I4891 Unspecified atrial fibrillation: Secondary | ICD-10-CM | POA: Diagnosis not present

## 2020-05-18 DIAGNOSIS — F1020 Alcohol dependence, uncomplicated: Secondary | ICD-10-CM | POA: Diagnosis not present

## 2020-05-18 DIAGNOSIS — I429 Cardiomyopathy, unspecified: Secondary | ICD-10-CM

## 2020-05-18 LAB — COMPREHENSIVE METABOLIC PANEL
ALT: 104 U/L — ABNORMAL HIGH (ref 0–44)
AST: 46 U/L — ABNORMAL HIGH (ref 15–41)
Albumin: 4.1 g/dL (ref 3.5–5.0)
Alkaline Phosphatase: 47 U/L (ref 38–126)
Anion gap: 10 (ref 5–15)
BUN: 18 mg/dL (ref 6–20)
CO2: 24 mmol/L (ref 22–32)
Calcium: 9 mg/dL (ref 8.9–10.3)
Chloride: 105 mmol/L (ref 98–111)
Creatinine, Ser: 1.07 mg/dL (ref 0.61–1.24)
GFR, Estimated: >60 mL/min (ref >60)
Glucose, Bld: 130 mg/dL — ABNORMAL HIGH (ref 70–99)
Potassium: 3.8 mmol/L (ref 3.5–5.1)
Sodium: 139 mmol/L (ref 135–145)
Total Bilirubin: 1.1 mg/dL (ref 0.3–1.2)
Total Protein: 7.4 g/dL (ref 6.5–8.1)

## 2020-05-18 LAB — CBC
HCT: 41.6 % (ref 39.0–52.0)
Hemoglobin: 13.9 g/dL (ref 13.0–17.0)
MCH: 32.5 pg (ref 26.0–34.0)
MCHC: 33.4 g/dL (ref 30.0–36.0)
MCV: 97.2 fL (ref 80.0–100.0)
Platelets: 350 10*3/uL (ref 150–400)
RBC: 4.28 MIL/uL (ref 4.22–5.81)
RDW: 13.2 % (ref 11.5–15.5)
WBC: 11.5 10*3/uL — ABNORMAL HIGH (ref 4.0–10.5)
nRBC: 0 % (ref 0.0–0.2)

## 2020-05-18 LAB — HEPARIN LEVEL (UNFRACTIONATED)
Heparin Unfractionated: 0.13 IU/mL — ABNORMAL LOW (ref 0.30–0.70)
Heparin Unfractionated: 0.26 IU/mL — ABNORMAL LOW (ref 0.30–0.70)
Heparin Unfractionated: 0.3 IU/mL (ref 0.30–0.70)

## 2020-05-18 LAB — ECHOCARDIOGRAM LIMITED
Calc EF: 30.7 %
Height: 71 in
S' Lateral: 5.45 cm
Single Plane A2C EF: 31.6 %
Single Plane A4C EF: 34 %
Weight: 4835.2 oz

## 2020-05-18 LAB — MAGNESIUM: Magnesium: 2.2 mg/dL (ref 1.7–2.4)

## 2020-05-18 MED ORDER — METOPROLOL TARTRATE 50 MG PO TABS
50.0000 mg | ORAL_TABLET | Freq: Four times a day (QID) | ORAL | Status: DC
  Administered 2020-05-18 – 2020-05-19 (×3): 50 mg via ORAL
  Filled 2020-05-18 (×4): qty 1

## 2020-05-18 MED ORDER — HEPARIN BOLUS VIA INFUSION
1500.0000 [IU] | Freq: Once | INTRAVENOUS | Status: AC
  Administered 2020-05-18: 1500 [IU] via INTRAVENOUS
  Filled 2020-05-18: qty 1500

## 2020-05-18 MED ORDER — FUROSEMIDE 10 MG/ML IJ SOLN
60.0000 mg | Freq: Three times a day (TID) | INTRAMUSCULAR | Status: DC
  Administered 2020-05-18 – 2020-05-19 (×5): 60 mg via INTRAVENOUS
  Filled 2020-05-18 (×5): qty 6

## 2020-05-18 MED ORDER — DIGOXIN 125 MCG PO TABS
0.1250 mg | ORAL_TABLET | Freq: Every day | ORAL | Status: DC
  Administered 2020-05-19 – 2020-05-22 (×4): 0.125 mg via ORAL
  Filled 2020-05-18 (×5): qty 1

## 2020-05-18 MED ORDER — HEPARIN BOLUS VIA INFUSION
3000.0000 [IU] | Freq: Once | INTRAVENOUS | Status: AC
  Administered 2020-05-18: 3000 [IU] via INTRAVENOUS
  Filled 2020-05-18: qty 3000

## 2020-05-18 MED ORDER — SACUBITRIL-VALSARTAN 24-26 MG PO TABS
1.0000 | ORAL_TABLET | Freq: Two times a day (BID) | ORAL | Status: DC
  Administered 2020-05-18 – 2020-05-22 (×9): 1 via ORAL
  Filled 2020-05-18 (×9): qty 1

## 2020-05-18 MED ORDER — PERFLUTREN LIPID MICROSPHERE
1.0000 mL | INTRAVENOUS | Status: AC | PRN
  Administered 2020-05-18: 4 mL via INTRAVENOUS
  Filled 2020-05-18: qty 10

## 2020-05-18 MED ORDER — DIGOXIN 0.25 MG/ML IJ SOLN
0.2500 mg | INTRAMUSCULAR | Status: AC
  Administered 2020-05-18 (×3): 0.25 mg via INTRAVENOUS
  Filled 2020-05-18 (×3): qty 2

## 2020-05-18 NOTE — Progress Notes (Signed)
Progress Note  Patient Name: Samuel [name] Date of Encounter: 05/18/2020  Primary Cardiologist: Debbe Odea, MD  Subjective   Has noted some improvement in orthopnea.  Is also noted increasing hand and ankle edema however.  Denies chest pain or palpitations.  Remains in A. fib in the 80s to 90s on diltiazem 15 mg/h.  Inpatient Medications    Scheduled Meds: . furosemide  60 mg Intravenous BID  . metoprolol tartrate  25 mg Oral Q6H  . pantoprazole  40 mg Oral Daily  . sucralfate  1 g Oral TID WC & HS   Continuous Infusions: . diltiazem (CARDIZEM) infusion 15 mg/hr (05/18/20 0040)  . heparin 2,200 Units/hr (05/18/20 0134)   PRN Meds: acetaminophen, morphine injection, ondansetron (ZOFRAN) IV, traMADol   Vital Signs    Vitals:   05/17/20 1633 05/17/20 2013 05/18/20 0143 05/18/20 0508  BP: (!) 125/101 113/88 115/88 (!) 129/94  Pulse: 85     Resp: 16  20 20   Temp: 97.9 F (36.6 C) 98 F (36.7 C) 97.9 F (36.6 C) 97.8 F (36.6 C)  TempSrc: Oral  Oral Oral  SpO2: 95% 96% 97% 96%  Weight: (!) 137.1 kg     Height:        Intake/Output Summary (Last 24 hours) at 05/18/2020 0751 Last data filed at 05/18/2020 0700 Gross per 24 hour  Intake 1548.73 ml  Output 800 ml  Net 748.73 ml   Filed Weights   05/16/20 1800 05/17/20 1633  Weight: (!) 137.9 kg (!) 137.1 kg    Physical Exam   GEN: Obese, in no acute distress.  HEENT: Grossly normal.  Neck: Supple, obese, moderately elevated JVP.  No carotid bruits, or masses. Cardiac: Irregularly irregular, distant, no murmurs, rubs, or gallops. No clubbing, cyanosis, trace bilateral ankle edema.  Radials 2+, DP/PT 2+ and equal bilaterally.  Respiratory:  Respirations regular and unlabored, diminished breath sounds bilaterally. GI: Obese, soft, nontender, nondistended, BS + x 4. MS: no deformity or atrophy. Skin: warm and dry, no rash. Neuro:  Strength and sensation are intact. Psych: AAOx3.  Normal  affect.  Labs    Chemistry Recent Labs  Lab 05/16/20 1522 05/18/20 0514  NA 139 139  K 3.8 3.8  CL 106 105  CO2 22 24  GLUCOSE 114* 130*  BUN 17 18  CREATININE 1.11 1.07  CALCIUM 9.0 9.0  PROT 8.0 7.4  ALBUMIN 4.6 4.1  AST 60* 46*  ALT 119* 104*  ALKPHOS 57 47  BILITOT 1.7* 1.1  GFRNONAA >60 >60  ANIONGAP 11 10     Hematology Recent Labs  Lab 05/16/20 1522 05/18/20 0514  WBC 12.6* 11.5*  RBC 4.50 4.28  HGB 14.6 13.9  HCT 43.1 41.6  MCV 95.8 97.2  MCH 32.4 32.5  MCHC 33.9 33.4  RDW 13.2 13.2  PLT 351 350    Cardiac Enzymes  Recent Labs  Lab 05/16/20 1826  TROPONINIHS 16     Radiology    CT ABDOMEN PELVIS W CONTRAST  Result Date: 05/16/2020 CLINICAL DATA:  Nonlocalized acute abdominal pain. EXAM: CT ABDOMEN AND PELVIS WITH CONTRAST TECHNIQUE: Multidetector CT imaging of the abdomen and pelvis was performed using the standard protocol following bolus administration of intravenous contrast. CONTRAST:  05/18/2020 OMNIPAQUE IOHEXOL 300 MG/ML  SOLN COMPARISON:  None. FINDINGS: Lower chest: Trace right pleural effusion. Hepatobiliary: The hepatic parenchyma is diffusely mildly hypodense compared to the splenic parenchyma consistent with fatty infiltration. Calcified gallstone noted within the gallbladder  lumen. No gallbladder wall thickening or pericholecystic fluid. No biliary dilatation. Pancreas: Diffusely atrophic. No focal lesion. Otherwise normal pancreatic contour. No surrounding inflammatory changes. No main pancreatic ductal dilatation. Spleen: Normal in size without focal abnormality. Adrenals/Urinary Tract: No adrenal nodule bilaterally. Bilateral kidneys enhance symmetrically. No hydronephrosis. No hydroureter. The urinary bladder is unremarkable. On delayed imaging, there is no urothelial wall thickening and there are no filling defects in the opacified portions of the bilateral collecting systems or ureters. Stomach/Bowel: PO contrast reaches the transverse  colon. Stomach is within normal limits. No evidence of bowel wall thickening or dilatation. Appendix appears normal. Vascular/Lymphatic: No abdominal aorta or iliac aneurysm. Mild atherosclerotic plaque of the aorta and its branches. No abdominal, pelvic, or inguinal lymphadenopathy. Reproductive: Prostate is unremarkable. Other: No intraperitoneal free fluid. No intraperitoneal free gas. No organized fluid collection. Musculoskeletal: No acute or significant osseous findings. IMPRESSION: 1. Trace right pleural effusion 2. Cholelithiasis with no CT findings of acute cholecystitis tightest choledocholithiasis. 3. Mild hepatic steatosis. 4.  Aortic Atherosclerosis (ICD10-I70.0). Electronically Signed   By: Tish Frederickson M.D.   On: 05/16/2020 19:50   DG Chest Portable 1 View  Result Date: 05/16/2020 CLINICAL DATA:  Shortness of breath. EXAM: PORTABLE CHEST 1 VIEW COMPARISON:  None. FINDINGS: The heart size appears to be generous. There are prominent interstitial lung markings bilaterally with blunting of the costophrenic angles bilaterally. There are streaky bibasilar airspace opacities suggestive of atelectasis. There is no pneumothorax. There is an old healed right clavicle fracture. IMPRESSION: Cardiomegaly with findings consistent with volume overload and possible developing interstitial edema. An atypical infectious process seems less likely but is not entirely excluded. Electronically Signed   By: Katherine Mantle M.D.   On: 05/16/2020 18:28   Telemetry    Atrial fibrillation, PVCs, mostly in the 80s.  Occasional up to 2-second pauses- Personally Reviewed  Cardiac Studies   2D Echocardiogram 4.30.2022  1. Left ventricular ejection fraction, by estimation, is 25 to 30%. The  left ventricle has severely decreased function. The left ventricle has no  regional wall motion abnormalities. The left ventricular internal cavity  size was mildly to moderately  dilated. Left ventricular diastolic  parameters are indeterminate.   2. Right ventricular systolic function is normal. The right ventricular  size is normal.   3. The mitral valve is normal in structure. Mild to moderate mitral valve  regurgitation. No evidence of mitral stenosis.   4. The aortic valve is normal in structure. Aortic valve regurgitation is  not visualized. No aortic stenosis is present.   5. The inferior vena cava is normal in size with greater than 50%  respiratory variability, suggesting right atrial pressure of 3 mmHg.  _____________   Patient Profile     45 y.o. male  with a history of morbid obesity, tobacco, marijuana, and ETOH abuse, who was admitted 4/30 w/ progressive abd discomfort and bloating assoc w/ progressive DOE and orthopnea.  He was found to be in afib w/ RVR.  Echo w/ EF 25-30%.  Assessment & Plan    1.  Atrial fibrillation with rapid ventricular response: Patient presented to the emergency department on April 29 with a 1 month history of abdominal bloating, cough, and progressive dyspnea with orthopnea.  Symptoms not improved with azithromycin as outpatient.  He was found to be in A. fib with RVR by his gastroenterologist and was referred to the ED at that point.  He has been reasonably well rate controlled in the 80s to  90s on IV diltiazem at 15 mg an hour.  Unfortunately, his EF is depressed to 25 to 30%.  In that setting, we would like to wean off diltiazem.  I have increased his metoprolol to 50 mg every 6 hours and we will load with IV digoxin today.  He is volume overloaded and will require diuresis today and likely tomorrow.  In the setting of LV dysfunction, he will require diagnostic catheterization and then we will likely plan TEE and cardioversion midweek.  Continue heparin with plan to transition to oral anticoagulation following diagnostic catheterization.  CHA2DS2VASc appears to equal at least 2 with heart failure and hypertension.  2.  Acute systolic congestive heart failure: EF 25  to 30% by echo on April 30.  This is a new diagnosis.  Body habitus makes exam somewhat challenging but he does appear to have moderately elevated neck veins with trace lower extremity edema.  He is also been experiencing orthopnea.  Yesterday, we added Lasix 60 mg twice daily and he noted limited output.  He is +1.7 L for admission.  I will increase Lasix to 60 mg 3 times daily.  Titrating beta-blocker as above.  Adding Entresto as renal function stable and pressure is elevated.  As above, will need right and left heart cardiac catheterization.  Suspect we can plan for this on Tuesday pending further diuresis.  The patient understands that risks include but are not limited to stroke (1 in 1000), death (1 in 1000), kidney failure [usually temporary] (1 in 500), bleeding (1 in 200), allergic reaction [possibly serious] (1 in 200), and agrees to proceed.    3.  Essential hypertension: Not previously diagnosed.  Continue beta-blocker and Entresto.  Diltiazem to be weaned off today.  4.  Alcohol abuse: Drinking 4 alcoholic beverages 2-5 times per week.  He says he is only been doing this since his mother died.  We discussed the importance of cessation as it pertains to A. fib as well as hepatic steatosis with abnormal LFTs.  5.  Tobacco abuse/marijuana abuse: Cessation advised.  6.  Morbid obesity: Would benefit from cardiac rehab in the setting of heart failure.  7.  Probable sleep apnea: He has had a 2-second pauses during periods of sleep overnight.  Likely has sleep apnea will need outpatient eval.  8.  Hepatic steatosis and abnormal LFTs: In setting of alcohol use.  LFT abnormality also may be secondary to passive congestion in the setting of heart failure and LV dysfunction.  Signed, Nicolasa Ducking, NP  05/18/2020, 7:51 AM    For questions or updates, please contact   Please consult [email or web address] for contact info under Cardiology/STEMI.

## 2020-05-18 NOTE — Progress Notes (Signed)
Patient Cardizem titrated and weaned off patient. Patient heart rate 80-90s Afib.

## 2020-05-18 NOTE — Progress Notes (Signed)
Attempted to decrease patient cardizem drip to 89ml. Heart rate 90-110s. Cardizem rate increase back to 12.46ml.  Will continue to titrate as needed.
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
## 2020-05-18 NOTE — Plan of Care (Signed)
  Problem: Education: Goal: Knowledge of General Education information will improve Description: Including pain rating scale, medication(s)/side effects and non-pharmacologic comfort measures Outcome: Progressing   Problem: Clinical Measurements: Goal: Ability to maintain clinical measurements within normal limits will improve Outcome: Progressing   Problem: Clinical Measurements: Goal: Respiratory complications will improve Outcome: Progressing   Problem: Clinical Measurements: Goal: Cardiovascular complication will be avoided Outcome: Progressing   Problem: Activity: Goal: Risk for activity intolerance will decrease Outcome: Progressing   
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
## 2020-05-18 NOTE — Progress Notes (Addendum)
Pt is complaining of numbness and swollen right hand. On assessment pt have + 1 swelling and abrasion.Pt did complained decrease sensation on the bottom palm close to the wrist.  MD Para March made aware. Will continue to monitor.

## 2020-05-19 DIAGNOSIS — I4891 Unspecified atrial fibrillation: Secondary | ICD-10-CM | POA: Diagnosis not present

## 2020-05-19 LAB — CBC
HCT: 44.7 % (ref 39.0–52.0)
Hemoglobin: 15.6 g/dL (ref 13.0–17.0)
MCH: 33.1 pg (ref 26.0–34.0)
MCHC: 34.9 g/dL (ref 30.0–36.0)
MCV: 94.9 fL (ref 80.0–100.0)
Platelets: 373 10*3/uL (ref 150–400)
RBC: 4.71 MIL/uL (ref 4.22–5.81)
RDW: 12.8 % (ref 11.5–15.5)
WBC: 12.5 10*3/uL — ABNORMAL HIGH (ref 4.0–10.5)
nRBC: 0 % (ref 0.0–0.2)

## 2020-05-19 LAB — COMPREHENSIVE METABOLIC PANEL
ALT: 101 U/L — ABNORMAL HIGH (ref 0–44)
AST: 44 U/L — ABNORMAL HIGH (ref 15–41)
Albumin: 4.5 g/dL (ref 3.5–5.0)
Alkaline Phosphatase: 51 U/L (ref 38–126)
Anion gap: 10 (ref 5–15)
BUN: 21 mg/dL — ABNORMAL HIGH (ref 6–20)
CO2: 27 mmol/L (ref 22–32)
Calcium: 9.6 mg/dL (ref 8.9–10.3)
Chloride: 102 mmol/L (ref 98–111)
Creatinine, Ser: 1.2 mg/dL (ref 0.61–1.24)
GFR, Estimated: >60 mL/min (ref >60)
Glucose, Bld: 123 mg/dL — ABNORMAL HIGH (ref 70–99)
Potassium: 4.4 mmol/L (ref 3.5–5.1)
Sodium: 139 mmol/L (ref 135–145)
Total Bilirubin: 1.2 mg/dL (ref 0.3–1.2)
Total Protein: 8.3 g/dL — ABNORMAL HIGH (ref 6.5–8.1)

## 2020-05-19 LAB — HEPARIN LEVEL (UNFRACTIONATED)
Heparin Unfractionated: <0.1 IU/mL — ABNORMAL LOW (ref 0.30–0.70)
Heparin Unfractionated: 0.38 IU/mL (ref 0.30–0.70)
Heparin Unfractionated: 0.57 IU/mL (ref 0.30–0.70)

## 2020-05-19 LAB — DIGOXIN LEVEL: Digoxin Level: 0.7 ng/mL — ABNORMAL LOW (ref 0.8–2.0)

## 2020-05-19 LAB — BRAIN NATRIURETIC PEPTIDE: B Natriuretic Peptide: 203.6 pg/mL — ABNORMAL HIGH (ref 0.0–100.0)

## 2020-05-19 MED ORDER — ASPIRIN 81 MG PO CHEW
81.0000 mg | CHEWABLE_TABLET | ORAL | Status: AC
  Administered 2020-05-20: 81 mg via ORAL
  Filled 2020-05-19: qty 1

## 2020-05-19 MED ORDER — SODIUM CHLORIDE 0.9% FLUSH: 3.0000 mL | INTRAVENOUS | Status: DC | PRN

## 2020-05-19 MED ORDER — SODIUM CHLORIDE 0.9 % IV SOLN: INTRAVENOUS | Status: DC

## 2020-05-19 MED ORDER — HEPARIN BOLUS VIA INFUSION
3000.0000 [IU] | Freq: Once | INTRAVENOUS | Status: AC
  Administered 2020-05-19: 3000 [IU] via INTRAVENOUS
  Filled 2020-05-19: qty 3000

## 2020-05-19 MED ORDER — SODIUM CHLORIDE 0.9% FLUSH
3.0000 mL | Freq: Two times a day (BID) | INTRAVENOUS | Status: DC
  Administered 2020-05-19 – 2020-05-21 (×3): 3 mL via INTRAVENOUS

## 2020-05-19 MED ORDER — SODIUM CHLORIDE 0.9 % IV SOLN: 250.0000 mL | INTRAVENOUS | Status: DC | PRN

## 2020-05-19 MED ORDER — METOPROLOL TARTRATE 50 MG PO TABS
100.0000 mg | ORAL_TABLET | Freq: Two times a day (BID) | ORAL | Status: DC
  Administered 2020-05-19 – 2020-05-21 (×5): 100 mg via ORAL
  Filled 2020-05-19 (×5): qty 2

## 2020-05-19 MED ORDER — METOPROLOL TARTRATE 50 MG PO TABS: 100.0000 mg | ORAL_TABLET | Freq: Two times a day (BID) | ORAL | Status: DC
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
## 2020-05-19 NOTE — H&P (View-Only) (Signed)
 Progress Note  Patient Name: Samuel [name] Date of Encounter: 05/19/2020  [facility] Cardiologist: Brian Agbor-Etang, MD   Subjective   Remains in Afib with rates 90-100. UOP much better. CMET pending. Mild leukocytosis this AM. He is able to lay flat on exam. Appears euvolemic.   Inpatient Medications    Scheduled Meds: . digoxin  0.125 mg Oral Daily  . furosemide  60 mg Intravenous TID AC  . metoprolol tartrate  50 mg Oral Q6H  . pantoprazole  40 mg Oral Daily  . sacubitril-valsartan  1 tablet Oral BID  . sucralfate  1 g Oral TID WC & HS   Continuous Infusions: . heparin 2,800 Units/hr (05/19/20 0444)   PRN Meds: acetaminophen, morphine injection, ondansetron (ZOFRAN) IV, traMADol   Vital Signs    Vitals:   05/18/20 2015 05/19/20 0219 05/19/20 0424 05/19/20 0801  BP:  121/84 122/90 121/89  Pulse: 83 98  75  Resp:  20 18 19  Temp:  98 F (36.7 C) 98.8 F (37.1 C) 97.8 F (36.6 C)  TempSrc:  Oral Oral Oral  SpO2:  100% 98% 97%  Weight:   133.1 kg   Height:        Intake/Output Summary (Last 24 hours) at 05/19/2020 0826 Last data filed at 05/19/2020 0700 Gross per 24 hour  Intake 2267.06 ml  Output 5800 ml  Net -3532.94 ml   Last 3 Weights 05/19/2020 05/17/2020 05/16/2020  Weight (lbs) 293 lb 8 oz 302 lb 3.2 oz 304 lb  Weight (kg) 133.131 kg 137.077 kg 137.893 kg      Telemetry    Afib HR 90-100, PVCs - Personally Reviewed  ECG     No new- Personally Reviewed  Physical Exam   GEN: No acute distress.   Neck: No JVD Cardiac: Irreg Irreg, no murmurs, rubs, or gallops.  Respiratory: Clear to auscultation bilaterally. GI: Soft, nontender, non-distended  MS: No edema; No deformity. Neuro:  Nonfocal  Psych: Normal affect   Labs    High Sensitivity Troponin:   Recent Labs  Lab 05/16/20 1826  TROPONINIHS 16      Chemistry Recent Labs  Lab 05/16/20 1522 05/18/20 0514  NA 139 139  K 3.8 3.8  CL 106 105  CO2 22 24  GLUCOSE 114*  130*  BUN 17 18  CREATININE 1.11 1.07  CALCIUM 9.0 9.0  PROT 8.0 7.4  ALBUMIN 4.6 4.1  AST 60* 46*  ALT 119* 104*  ALKPHOS 57 47  BILITOT 1.7* 1.1  GFRNONAA >60 >60  ANIONGAP 11 10     Hematology Recent Labs  Lab 05/16/20 1522 05/18/20 0514 05/19/20 0208  WBC 12.6* 11.5* 12.5*  RBC 4.50 4.28 4.71  HGB 14.6 13.9 15.6  HCT 43.1 41.6 44.7  MCV 95.8 97.2 94.9  MCH 32.4 32.5 33.1  MCHC 33.9 33.4 34.9  RDW 13.2 13.2 12.8  PLT 351 350 373    BNPNo results for input(s): BNP, PROBNP in the last 168 hours.   DDimer No results for input(s): DDIMER in the last 168 hours.   Radiology    ECHOCARDIOGRAM COMPLETE  Result Date: 05/17/2020    ECHOCARDIOGRAM REPORT   Patient Name:   Samuel [name] Date of Exam: 05/17/2020 Medical Rec #:  [id number]                 Height:       71.0 in Accession #:    [id number]                  Weight:       304.0 lb Date of Birth:  10/28/1975                 BSA:          2.519 m Patient Age:    44 years                  BP:           144/115 mmHg Patient Gender: M                         HR:           71 bpm. Exam Location:  [facility] Procedure: 2D Echo, Cardiac Doppler and Color Doppler Indications:     Atrial Fibrillation I48.91  History:         Patient has no prior history of Echocardiogram examinations.  Sonographer:     Christy Roar Referring Phys:  [id number] HAZEL V DUNCAN Diagnosing Phys: Alexander Paraschos MD IMPRESSIONS  1. Left ventricular ejection fraction, by estimation, is 25 to 30%. The left ventricle has severely decreased function. The left ventricle has no regional wall motion abnormalities. The left ventricular internal cavity size was mildly to moderately dilated. Left ventricular diastolic parameters are indeterminate.  2. Right ventricular systolic function is normal. The right ventricular size is normal.  3. The mitral valve is normal in structure. Mild to moderate mitral valve regurgitation. No evidence of mitral stenosis.  4. The  aortic valve is normal in structure. Aortic valve regurgitation is not visualized. No aortic stenosis is present.  5. The inferior vena cava is normal in size with greater than 50% respiratory variability, suggesting right atrial pressure of 3 mmHg. FINDINGS  Left Ventricle: Left ventricular ejection fraction, by estimation, is 25 to 30%. The left ventricle has severely decreased function. The left ventricle has no regional wall motion abnormalities. The left ventricular internal cavity size was mildly to moderately dilated. There is no left ventricular hypertrophy. Left ventricular diastolic parameters are indeterminate. Right Ventricle: The right ventricular size is normal. No increase in right ventricular wall thickness. Right ventricular systolic function is normal. Left Atrium: Left atrial size was normal in size. Right Atrium: Right atrial size was normal in size. Pericardium: There is no evidence of pericardial effusion. Mitral Valve: The mitral valve is normal in structure. Mild to moderate mitral valve regurgitation. No evidence of mitral valve stenosis. Tricuspid Valve: The tricuspid valve is normal in structure. Tricuspid valve regurgitation is mild . No evidence of tricuspid stenosis. Aortic Valve: The aortic valve is normal in structure. Aortic valve regurgitation is not visualized. No aortic stenosis is present. Aortic valve peak gradient measures 5.7 mmHg. Pulmonic Valve: The pulmonic valve was normal in structure. Pulmonic valve regurgitation is not visualized. No evidence of pulmonic stenosis. Aorta: The aortic root is normal in size and structure. Venous: The inferior vena cava is normal in size with greater than 50% respiratory variability, suggesting right atrial pressure of 3 mmHg. IAS/Shunts: No atrial level shunt detected by color flow Doppler.  LEFT VENTRICLE PLAX 2D LVIDd:         6.03 cm  Diastology LVIDs:         5.45 cm  LV e' medial:  7.40 cm/s LV PW:         1.14 cm  LV e' lateral: 8.92  cm/s LV IVS:        0.99 cm LVOT diam:       2.00 cm LVOT Area:     3.14 cm  RIGHT VENTRICLE RV Mid diam:    2.68 cm RV S prime:     12.40 cm/s TAPSE (M-mode): 1.6 cm LEFT ATRIUM              Index       RIGHT ATRIUM           Index LA diam:        5.00 cm  1.98 cm/m  RA Area:     13.50 cm LA Vol (A2C):   85.3 ml  33.86 ml/m RA Volume:   27.40 ml  10.88 ml/m LA Vol (A4C):   115.0 ml 45.65 ml/m LA Biplane Vol: 102.0 ml 40.49 ml/m  AORTIC VALVE                PULMONIC VALVE AV Area (Vmax): 2.50 cm    PV Vmax:       0.95 m/s AV Vmax:        119.00 cm/s PV Peak grad:  3.6 mmHg AV Peak Grad:   5.7 mmHg LVOT Vmax:      94.70 cm/s  AORTA Ao Root diam: 2.70 cm TRICUSPID VALVE TR Peak grad:   20.2 mmHg TR Vmax:        225.00 cm/s  SHUNTS Systemic Diam: 2.00 cm Alexander Paraschos MD Electronically signed by Alexander Paraschos MD Signature Date/Time: 05/17/2020/2:27:44 PM    Final    ECHOCARDIOGRAM LIMITED  Result Date: 05/18/2020    ECHOCARDIOGRAM LIMITED REPORT   Patient Name:   Samuel [name] Date of Exam: 05/18/2020 Medical Rec #:  [id number]                 Height:       71.0 in Accession #:    [id number]                Weight:       302.2 lb Date of Birth:  01/30/1975                 BSA:          2.513 m Patient Age:    44 years                  BP:           129/94 mmHg Patient Gender: M                         HR:           98 bpm. Exam Location:  [facility] Procedure: Limited Echo and Intracardiac Opacification Agent Indications:     Atrial Fibrillation  History:         Patient has prior history of Echocardiogram examinations. Risk                  Factors:Morbid Obesity.  Sonographer:     L Thornton-Maynard Referring Phys:  [id number] Elimelech Houseman RONALD BERGE Diagnosing Phys: Alexander Paraschos MD  Sonographer Comments: Image acquisition challenging due to patient body habitus. IMPRESSIONS  1. Left ventricular ejection fraction, by estimation, is 30 to 35%. The left ventricle has moderately decreased  function. The left ventricle demonstrates global hypokinesis. The left ventricular internal cavity size was moderately dilated.  2. Right ventricular systolic function is normal. The right ventricular size is normal.  3. The mitral valve is normal in structure. No evidence of mitral valve regurgitation. No evidence   of mitral stenosis.  4. The aortic valve is normal in structure. Aortic valve regurgitation is not visualized. No aortic stenosis is present.  5. The inferior vena cava is normal in size with greater than 50% respiratory variability, suggesting right atrial pressure of 3 mmHg. FINDINGS  Left Ventricle: Left ventricular ejection fraction, by estimation, is 30 to 35%. The left ventricle has moderately decreased function. The left ventricle demonstrates global hypokinesis. Definity contrast agent was given IV to delineate the left ventricular endocardial borders. The left ventricular internal cavity size was moderately dilated. There is no left ventricular hypertrophy. Right Ventricle: The right ventricular size is normal. No increase in right ventricular wall thickness. Right ventricular systolic function is normal. Left Atrium: Left atrial size was normal in size. Right Atrium: Right atrial size was normal in size. Pericardium: There is no evidence of pericardial effusion. Mitral Valve: The mitral valve is normal in structure. No evidence of mitral valve stenosis. Tricuspid Valve: The tricuspid valve is normal in structure. Tricuspid valve regurgitation is not demonstrated. No evidence of tricuspid stenosis. Aortic Valve: The aortic valve is normal in structure. Aortic valve regurgitation is not visualized. No aortic stenosis is present. Pulmonic Valve: The pulmonic valve was normal in structure. Pulmonic valve regurgitation is not visualized. No evidence of pulmonic stenosis. Aorta: The aortic root is normal in size and structure. Venous: The inferior vena cava is normal in size with greater than 50%  respiratory variability, suggesting right atrial pressure of 3 mmHg. IAS/Shunts: No atrial level shunt detected by color flow Doppler. LEFT VENTRICLE PLAX 2D LVIDd:         6.42 cm LVIDs:         5.45 cm LV PW:         0.94 cm LV IVS:        1.00 cm  LV Volumes (MOD) LV vol d, MOD A2C: 152.0 ml LV vol d, MOD A4C: 209.0 ml LV vol s, MOD A2C: 104.0 ml LV vol s, MOD A4C: 138.0 ml LV SV MOD A2C:     48.0 ml LV SV MOD A4C:     209.0 ml LV SV MOD BP:      57.2 ml Alexander Paraschos MD Electronically signed by Alexander Paraschos MD Signature Date/Time: 05/18/2020/1:38:53 PM    Final     Cardiac Studies   Echo 05/17/20  1. Left ventricular ejection fraction, by estimation, is 25 to 30%. The  left ventricle has severely decreased function. The left ventricle has no  regional wall motion abnormalities. The left ventricular internal cavity  size was mildly to moderately  dilated. Left ventricular diastolic parameters are indeterminate.  2. Right ventricular systolic function is normal. The right ventricular  size is normal.  3. The mitral valve is normal in structure. Mild to moderate mitral valve  regurgitation. No evidence of mitral stenosis.  4. The aortic valve is normal in structure. Aortic valve regurgitation is  not visualized. No aortic stenosis is present.  5. The inferior vena cava is normal in size with greater than 50%  respiratory variability, suggesting right atrial pressure of 3 mmHg.   Limited echo 05/18/20 1. Left ventricular ejection fraction, by estimation, is 30 to 35%. The  left ventricle has moderately decreased function. The left ventricle  demonstrates global hypokinesis. The left ventricular internal cavity size  was moderately dilated.  2. Right ventricular systolic function is normal. The right ventricular  size is normal.  3. The mitral valve is normal in structure.   No evidence of mitral valve  regurgitation. No evidence of mitral stenosis.  4. The aortic valve is  normal in structure. Aortic valve regurgitation is  not visualized. No aortic stenosis is present.  5. The inferior vena cava is normal in size with greater than 50%  respiratory variability, suggesting right atrial pressure of 3 mmHg.   Patient Profile     44 y.o. male with pmh of morbid obesity, tobacco/marijuana/ETOH abuse who is being seen for Afib, acute CHF with new CM.   Assessment & Plan    Afib with RVR - presented with 1 month of DOE, orthopnea, abdominal bloating found to be in Afib and acute heart failure. EF came back reduced - Rates well controlled with diltiazem>>stopped for reduced EF - started on metoprolol 50mg Q6hours>>wil consolidate to 100mg BID - started on IV digoxin> 0.125mg daily. Dig level 0.7 - continue IV heparin for CHADSVASC at least 2 (CHF, HTN). He will need cardiac cath for low EF. Can transition to a/c post- cath. - Patient remains in rate controlled Afib, can plan for TEE/DCCV, will discuss timing of procedure/procedures with MD  Acute systolic HF (EF 25-30%) - new dx per echo this admission. Limited echo yesterday showed EF 30-35% - IV lasix 60mg BID>increased to TID - continue BB  - Entresto 24-26mgBID - Digoxin 0.125mg daily - UOP -5.8L. Weight 304lbs>293lbs.  - He is able to lay flat on exam, do not appreciated JVD or significant LLE. Will check a BNP.  Can possibly transition to PO lasix - CMET pending. - Given that he can lay flat likely cath tomorrow  HTN - continue metoprolol, entresto - pressures improved  Alcohol abuse - reported 4 beverages 2-5 times a week - cessation encouraged  Tobacco and marijuana use - cessation advised  Overnight pauses - might need sleep study as OP  Abnormal LFTs - possible from alcohol use vs CHF - trending down - work-up per IM, plan for OP follow-up   For questions or updates, please contact [facility] Please consult [email or web address] for contact info under      Signed, Cadence H Furth,  PA-C  05/19/2020, 8:26 AM    I have independently seen and examined the patient and agree with the findings and plan, as documented in the PA/NP's note, with the following additions/changes.  Patient continues to feel better and is almost back to his baseline in regard to breathing.  He denies chest pain and shortness of breath as well as palpitations and lightheadedness.  He is eager to move forward with cardiac procedures including catheterization and TEE guided cardioversion.  Physical exam is notable for morbidly obese man seated comfortably in bed.  Heart sounds are distant and irregularly irregular.  Breath sounds are mildly diminished throughout.  There is trace pretibial edema.  JVP difficult to assess due to body habitus.  Telemetry shows atrial fibrillation with ventricular rates of 90 to 110 bpm with occasional PVCs versus aberrancy.  Labs are notable for potassium 4.4, creatinine 1.2, hemoglobin 15.6, platelets 373.  I suspect that cardiomyopathy is most likely related to tachycardia in the setting of atrial fibrillation with rapid ventricular response prior to this admission.  Plans have already been made for right and left heart catheterization prior to TEE guided cardioversion, which is reasonable.  I have discussed these procedures with Mr. Krause and, including potential risks and benefits.  He wishes to proceed.  We will proceed with right and left heart catheterization with possible PCI tomorrow and   likely TEE guided cardioversion on Wednesday.  For now, IV heparin should be continued; this can hopefully be transitioned to a NOAC on Wednesday if there are no bleeding/vascular complications.  We will continue with furosemide 60 mg IV 3 times daily for ongoing diuresis as well as rate control with metoprolol tartrate 100 mg twice daily and digoxin 125 mcg daily.  Hopefully, digoxin can be discontinued HFpEF cardioversion.  Entresto will be continued as well, which can hopefully be escalated  as blood pressure tolerates on an outpatient basis.  Approximately 40 minutes were spent on this encounter, of which greater than 50% was devoted to counseling the patient and his family.  Norvella Loscalzo, MD [facility]  

## 2020-05-19 NOTE — Progress Notes (Addendum)
Progress Note    Samuel [name]  MWU:[id number] DOB: July 23, 1975  DOA: 05/16/2020 PCP: Pcp, No      Brief Narrative:    Medical records reviewed and are as summarized below:  Samuel [name] is a 45 y.o. male with medical history significant for morbid obesity, alcohol use disorder, was referred from the gastroenterology office because of rapid heart rate and irregular heart rhythm.  He complained of epigastric pain, abdominal distention, palpitations and shortness of breath.  He was found to have hypertension and atrial fibrillation with RVR with heart rate in the 160s.      Assessment/Plan:   Principal Problem:   Rapid atrial fibrillation, new onset (HCC) Active Problems:   Alcohol use disorder, moderate, dependence (HCC)   Obesity, Class III, BMI 40-49.9 (morbid obesity) (HCC)   Cholelithiasis without obstruction   Epigastric pain   NSAID long-term use   Cardiomyopathy (HCC)   Body mass index is 40.93 kg/m.  (Morbid obesity)   Atrial fibrillation with RVR: Has been started on oral metoprolol and IV digoxin.  Continue IV heparin infusion and monitor heparin level per protocol.  Plan for left heart cath tomorrow.  Follow-up with cardiologist.  Cardiomyopathy: Specifics unknown. He's on IV Lasix.  Repeat 2D echo showed EF estimated at 30 to 35%.  Previous 2D echo showed EF estimated at 25 to 30%.  Alcohol and tobacco use disorder: Advised to quit drinking alcohol and to stop smoking cigarettes.  Hypertension: Continue antihypertensives  Cholelithiasis, hepatic steatosis, elevated liver enzymes: Liver enzymes are trending down.  Outpatient follow-up   Diet Order            Diet NPO time specified Except for: Sips with Meds  Diet effective midnight           Diet regular Room service appropriate? Yes; Fluid consistency: Thin; Fluid restriction: 2000 mL Fluid  Diet effective now                     Consultants:  Cardiologist  Procedures:  None    Medications:   . digoxin  0.125 mg Oral Daily  . furosemide  60 mg Intravenous TID AC  . metoprolol tartrate  100 mg Oral BID  . pantoprazole  40 mg Oral Daily  . sacubitril-valsartan  1 tablet Oral BID  . sodium chloride flush  3 mL Intravenous Q12H  . sucralfate  1 g Oral TID WC & HS   Continuous Infusions: . heparin 2,800 Units/hr (05/19/20 0924)     Anti-infectives (From admission, onward)   None             Family Communication/Anticipated D/C date and plan/Code Status   DVT prophylaxis:      Code Status: Full Code  Family Communication: Father at the bedside Disposition Plan:    Status is: Inpatient  Remains inpatient appropriate because:IV treatments appropriate due to intensity of illness or inability to take PO   Dispo: The patient is from: Home              Anticipated d/c is to: Home              Patient currently is not medically stable to d/c.   Difficult to place patient No           Subjective:   Interval events noted.  No chest pain or shortness of breath.  He feels a little better today.  Objective:  Vitals:   05/19/20 0424 05/19/20 0801 05/19/20 1123 05/19/20 1357  BP: 122/90 121/89 109/86   Pulse:  75 81   Resp: 18 19 19    Temp: 98.8 F (37.1 C) 97.8 F (36.6 C) (!) 97.5 F (36.4 C)   TempSrc: Oral Oral Oral   SpO2: 98% 97% 98%   Weight: 133.1 kg   133.1 kg  Height:       No data found.   Intake/Output Summary (Last 24 hours) at 05/19/2020 1449 Last data filed at 05/19/2020 1403 Gross per 24 hour  Intake 1827.06 ml  Output 4275 ml  Net -2447.94 ml   Filed Weights   05/17/20 1633 05/19/20 0424 05/19/20 1357  Weight: (!) 137.1 kg 133.1 kg 133.1 kg    Exam:  GEN: NAD SKIN: No rash EYES: EOMI ENT: MMM CV: Irregular rate and rhythm, tachycardic PULM: CTA B ABD: soft,obese, NT, +BS CNS: AAO x 3, chronic mild weakness of right temporal  fingers EXT: No edema or tenderness          Data Reviewed:   I have personally reviewed following labs and imaging studies:  Labs: Labs show the following:   Basic Metabolic Panel: Recent Labs  Lab 05/16/20 1522 05/17/20 1845 05/18/20 0514 05/19/20 1105  NA 139  --  139 139  K 3.8  --  3.8 4.4  CL 106  --  105 102  CO2 22  --  24 27  GLUCOSE 114*  --  130* 123*  BUN 17  --  18 21*  CREATININE 1.11  --  1.07 1.20  CALCIUM 9.0  --  9.0 9.6  MG  --  2.3 2.2  --    GFR Estimated Creatinine Clearance: 108.2 mL/min (by C-G formula based on SCr of 1.2 mg/dL). Liver Function Tests: Recent Labs  Lab 05/16/20 1522 05/18/20 0514 05/19/20 1105  AST 60* 46* 44*  ALT 119* 104* 101*  ALKPHOS 57 47 51  BILITOT 1.7* 1.1 1.2  PROT 8.0 7.4 8.3*  ALBUMIN 4.6 4.1 4.5   Recent Labs  Lab 05/16/20 1522  LIPASE 28   No results for input(s): AMMONIA in the last 168 hours. Coagulation profile Recent Labs  Lab 05/16/20 1826  INR 1.1    CBC: Recent Labs  Lab 05/16/20 1522 05/18/20 0514 05/19/20 0208  WBC 12.6* 11.5* 12.5*  HGB 14.6 13.9 15.6  HCT 43.1 41.6 44.7  MCV 95.8 97.2 94.9  PLT 351 350 373   Cardiac Enzymes: No results for input(s): CKTOTAL, CKMB, CKMBINDEX, TROPONINI in the last 168 hours. BNP (last 3 results) No results for input(s): PROBNP in the last 8760 hours. CBG: No results for input(s): GLUCAP in the last 168 hours. D-Dimer: No results for input(s): DDIMER in the last 72 hours. Hgb A1c: No results for input(s): HGBA1C in the last 72 hours. Lipid Profile: No results for input(s): CHOL, HDL, LDLCALC, TRIG, CHOLHDL, LDLDIRECT in the last 72 hours. Thyroid function studies: No results for input(s): TSH, T4TOTAL, T3FREE, THYROIDAB in the last 72 hours.  Invalid input(s): FREET3 Anemia work up: No results for input(s): VITAMINB12, FOLATE, FERRITIN, TIBC, IRON, RETICCTPCT in the last 72 hours. Sepsis Labs: Recent Labs  Lab 05/16/20 1522  05/18/20 0514 05/19/20 0208  WBC 12.6* 11.5* 12.5*    Microbiology Recent Results (from the past 240 hour(s))  Resp Panel by RT-PCR (Flu A&B, Covid) Nasopharyngeal Swab     Status: None   Collection Time: 05/16/20  6:26 PM  Specimen: Nasopharyngeal Swab; Nasopharyngeal(NP) swabs in vial transport medium  Result Value Ref Range Status   SARS Coronavirus 2 by RT PCR NEGATIVE NEGATIVE Final    Comment: (NOTE) SARS-CoV-2 target nucleic acids are NOT DETECTED.  The SARS-CoV-2 RNA is generally detectable in upper respiratory specimens during the acute phase of infection. The lowest concentration of SARS-CoV-2 viral copies this assay can detect is 138 copies/mL. A negative result does not preclude SARS-Cov-2 infection and should not be used as the sole basis for treatment or other patient management decisions. A negative result may occur with  improper specimen collection/handling, submission of specimen other than nasopharyngeal swab, presence of viral mutation(s) within the areas targeted by this assay, and inadequate number of viral copies(<138 copies/mL). A negative result must be combined with clinical observations, patient history, and epidemiological information. The expected result is Negative.  Fact Sheet for Patients:  BloggerCourse.com  Fact Sheet for Healthcare Providers:  SeriousBroker.it  This test is no t yet approved or cleared by the [place] FDA and  has been authorized for detection and/or diagnosis of SARS-CoV-2 by FDA under an Emergency Use Authorization (EUA). This EUA will remain  in effect (meaning this test can be used) for the duration of the COVID-19 declaration under Section 564(b)(1) of the Act, 21 U.S.C.section 360bbb-3(b)(1), unless the authorization is terminated  or revoked sooner.       Influenza A by PCR NEGATIVE NEGATIVE Final   Influenza B by PCR NEGATIVE NEGATIVE Final    Comment:  (NOTE) The Xpert Xpress SARS-CoV-2/FLU/RSV plus assay is intended as an aid in the diagnosis of influenza from Nasopharyngeal swab specimens and should not be used as a sole basis for treatment. Nasal washings and aspirates are unacceptable for Xpert Xpress SARS-CoV-2/FLU/RSV testing.  Fact Sheet for Patients: BloggerCourse.com  Fact Sheet for Healthcare Providers: SeriousBroker.it  This test is not yet approved or cleared by the [place] FDA and has been authorized for detection and/or diagnosis of SARS-CoV-2 by FDA under an Emergency Use Authorization (EUA). This EUA will remain in effect (meaning this test can be used) for the duration of the COVID-19 declaration under Section 564(b)(1) of the Act, 21 U.S.C. section 360bbb-3(b)(1), unless the authorization is terminated or revoked.  Performed at [facility], [address] [place], [place] [address]     Procedures and diagnostic studies:  ECHOCARDIOGRAM LIMITED  Result Date: 05/18/2020    ECHOCARDIOGRAM LIMITED REPORT   Patient Name:   Samuel [name] [facility] [name] Date of Exam: 05/18/2020 Medical Rec #:  [id number]                 Height:       71.0 in Accession #:    [id number]                Weight:       302.2 lb Date of Birth:  August 06, 1975                 BSA:          2.513 m Patient Age:    44 years                  BP:           129/94 mmHg Patient Gender: M                         HR:  98 bpm. Exam Location:  [facility] Procedure: Limited Echo and Intracardiac Opacification Agent Indications:     Atrial Fibrillation  History:         Patient has prior history of Echocardiogram examinations. Risk                  Factors:Morbid Obesity.  Sonographer:     L Thornton-Maynard Referring Phys:  [id number] BERGE Diagnosing Phys: Marcina Millard MD  Sonographer Comments: Image acquisition challenging due to patient body habitus. IMPRESSIONS  1. Left  ventricular ejection fraction, by estimation, is 30 to 35%. The left ventricle has moderately decreased function. The left ventricle demonstrates global hypokinesis. The left ventricular internal cavity size was moderately dilated.  2. Right ventricular systolic function is normal. The right ventricular size is normal.  3. The mitral valve is normal in structure. No evidence of mitral valve regurgitation. No evidence of mitral stenosis.  4. The aortic valve is normal in structure. Aortic valve regurgitation is not visualized. No aortic stenosis is present.  5. The inferior vena cava is normal in size with greater than 50% respiratory variability, suggesting right atrial pressure of 3 mmHg. FINDINGS  Left Ventricle: Left ventricular ejection fraction, by estimation, is 30 to 35%. The left ventricle has moderately decreased function. The left ventricle demonstrates global hypokinesis. Definity contrast agent was given IV to delineate the left ventricular endocardial borders. The left ventricular internal cavity size was moderately dilated. There is no left ventricular hypertrophy. Right Ventricle: The right ventricular size is normal. No increase in right ventricular wall thickness. Right ventricular systolic function is normal. Left Atrium: Left atrial size was normal in size. Right Atrium: Right atrial size was normal in size. Pericardium: There is no evidence of pericardial effusion. Mitral Valve: The mitral valve is normal in structure. No evidence of mitral valve stenosis. Tricuspid Valve: The tricuspid valve is normal in structure. Tricuspid valve regurgitation is not demonstrated. No evidence of tricuspid stenosis. Aortic Valve: The aortic valve is normal in structure. Aortic valve regurgitation is not visualized. No aortic stenosis is present. Pulmonic Valve: The pulmonic valve was normal in structure. Pulmonic valve regurgitation is not visualized. No evidence of pulmonic stenosis. Aorta: The aortic root is  normal in size and structure. Venous: The inferior vena cava is normal in size with greater than 50% respiratory variability, suggesting right atrial pressure of 3 mmHg. IAS/Shunts: No atrial level shunt detected by color flow Doppler. LEFT VENTRICLE PLAX 2D LVIDd:         6.42 cm LVIDs:         5.45 cm LV PW:         0.94 cm LV IVS:        1.00 cm  LV Volumes (MOD) LV vol d, MOD A2C: 152.0 ml LV vol d, MOD A4C: 209.0 ml LV vol s, MOD A2C: 104.0 ml LV vol s, MOD A4C: 138.0 ml LV SV MOD A2C:     48.0 ml LV SV MOD A4C:     209.0 ml LV SV MOD BP:      57.2 ml Marcina Millard MD Electronically signed by Marcina Millard MD Signature Date/Time: 05/18/2020/1:38:53 PM    Final                LOS: 3 days   Kyara Boxer  [facility]   Pager on [email or web address]. If 7PM-7AM, please contact night-coverage at [email or web address]     05/19/2020, 2:49 PM

## 2020-05-19 NOTE — Plan of Care (Signed)
  Problem: Health Behavior/Discharge Planning: Goal: Ability to manage health-related needs will improve Outcome: Progressing   Problem: Clinical Measurements: Goal: Ability to maintain clinical measurements within normal limits will improve Outcome: Progressing   Problem: Clinical Measurements: Goal: Respiratory complications will improve Outcome: Progressing   Problem: Clinical Measurements: Goal: Cardiovascular complication will be avoided Outcome: Progressing   Problem: Activity: Goal: Risk for activity intolerance will decrease Outcome: Progressing   Problem: Safety: Goal: Ability to remain free from injury will improve Outcome: Progressing

## 2020-05-19 NOTE — Progress Notes (Addendum)
Progress Note  Patient Name: Samuel [name] Date of Encounter: 05/19/2020  [facility] Cardiologist: Debbe Odea, MD   Subjective   Remains in Afib with rates 90-100. UOP much better. CMET pending. Mild leukocytosis this AM. He is able to lay flat on exam. Appears euvolemic.   Inpatient Medications    Scheduled Meds: . digoxin  0.125 mg Oral Daily  . furosemide  60 mg Intravenous TID AC  . metoprolol tartrate  50 mg Oral Q6H  . pantoprazole  40 mg Oral Daily  . sacubitril-valsartan  1 tablet Oral BID  . sucralfate  1 g Oral TID WC & HS   Continuous Infusions: . heparin 2,800 Units/hr (05/19/20 0444)   PRN Meds: acetaminophen, morphine injection, ondansetron (ZOFRAN) IV, traMADol   Vital Signs    Vitals:   05/18/20 2015 05/19/20 0219 05/19/20 0424 05/19/20 0801  BP:  121/84 122/90 121/89  Pulse: 83 98  75  Resp:  20 18 19   Temp:  98 F (36.7 C) 98.8 F (37.1 C) 97.8 F (36.6 C)  TempSrc:  Oral Oral Oral  SpO2:  100% 98% 97%  Weight:   133.1 kg   Height:        Intake/Output Summary (Last 24 hours) at 05/19/2020 0826 Last data filed at 05/19/2020 0700 Gross per 24 hour  Intake 2267.06 ml  Output 5800 ml  Net -3532.94 ml   Last 3 Weights 05/19/2020 05/17/2020 05/16/2020  Weight (lbs) 293 lb 8 oz 302 lb 3.2 oz 304 lb  Weight (kg) 133.131 kg 137.077 kg 137.893 kg      Telemetry    Afib HR 90-100, PVCs - Personally Reviewed  ECG     No new- Personally Reviewed  Physical Exam   GEN: No acute distress.   Neck: No JVD Cardiac: Irreg Irreg, no murmurs, rubs, or gallops.  Respiratory: Clear to auscultation bilaterally. GI: Soft, nontender, non-distended  MS: No edema; No deformity. Neuro:  Nonfocal  Psych: Normal affect   Labs    High Sensitivity Troponin:   Recent Labs  Lab 05/16/20 1826  TROPONINIHS 16      Chemistry Recent Labs  Lab 05/16/20 1522 05/18/20 0514  NA 139 139  K 3.8 3.8  CL 106 105  CO2 22 24  GLUCOSE 114*  130*  BUN 17 18  CREATININE 1.11 1.07  CALCIUM 9.0 9.0  PROT 8.0 7.4  ALBUMIN 4.6 4.1  AST 60* 46*  ALT 119* 104*  ALKPHOS 57 47  BILITOT 1.7* 1.1  GFRNONAA >60 >60  ANIONGAP 11 10     Hematology Recent Labs  Lab 05/16/20 1522 05/18/20 0514 05/19/20 0208  WBC 12.6* 11.5* 12.5*  RBC 4.50 4.28 4.71  HGB 14.6 13.9 15.6  HCT 43.1 41.6 44.7  MCV 95.8 97.2 94.9  MCH 32.4 32.5 33.1  MCHC 33.9 33.4 34.9  RDW 13.2 13.2 12.8  PLT 351 350 373    BNPNo results for input(s): BNP, PROBNP in the last 168 hours.   DDimer No results for input(s): DDIMER in the last 168 hours.   Radiology    ECHOCARDIOGRAM COMPLETE  Result Date: 05/17/2020    ECHOCARDIOGRAM REPORT   Patient Name:   05/19/2020 [name] Date of Exam: 05/17/2020 Medical Rec #:  05/19/2020                 Height:       71.0 in Accession #:    [id number]  Weight:       304.0 lb Date of Birth:  1975/07/08                 BSA:          2.519 m Patient Age:    44 years                  BP:           144/115 mmHg Patient Gender: M                         HR:           71 bpm. Exam Location:  [facility] Procedure: 2D Echo, Cardiac Doppler and Color Doppler Indications:     Atrial Fibrillation I48.91  History:         Patient has no prior history of Echocardiogram examinations.  Sonographer:     Neysa Bonitohristy Roar Referring Phys:  [id number] Andris BaumannHAZEL V DUNCAN Diagnosing Phys: Marcina MillardAlexander Paraschos MD IMPRESSIONS  1. Left ventricular ejection fraction, by estimation, is 25 to 30%. The left ventricle has severely decreased function. The left ventricle has no regional wall motion abnormalities. The left ventricular internal cavity size was mildly to moderately dilated. Left ventricular diastolic parameters are indeterminate.  2. Right ventricular systolic function is normal. The right ventricular size is normal.  3. The mitral valve is normal in structure. Mild to moderate mitral valve regurgitation. No evidence of mitral stenosis.  4. The  aortic valve is normal in structure. Aortic valve regurgitation is not visualized. No aortic stenosis is present.  5. The inferior vena cava is normal in size with greater than 50% respiratory variability, suggesting right atrial pressure of 3 mmHg. FINDINGS  Left Ventricle: Left ventricular ejection fraction, by estimation, is 25 to 30%. The left ventricle has severely decreased function. The left ventricle has no regional wall motion abnormalities. The left ventricular internal cavity size was mildly to moderately dilated. There is no left ventricular hypertrophy. Left ventricular diastolic parameters are indeterminate. Right Ventricle: The right ventricular size is normal. No increase in right ventricular wall thickness. Right ventricular systolic function is normal. Left Atrium: Left atrial size was normal in size. Right Atrium: Right atrial size was normal in size. Pericardium: There is no evidence of pericardial effusion. Mitral Valve: The mitral valve is normal in structure. Mild to moderate mitral valve regurgitation. No evidence of mitral valve stenosis. Tricuspid Valve: The tricuspid valve is normal in structure. Tricuspid valve regurgitation is mild . No evidence of tricuspid stenosis. Aortic Valve: The aortic valve is normal in structure. Aortic valve regurgitation is not visualized. No aortic stenosis is present. Aortic valve peak gradient measures 5.7 mmHg. Pulmonic Valve: The pulmonic valve was normal in structure. Pulmonic valve regurgitation is not visualized. No evidence of pulmonic stenosis. Aorta: The aortic root is normal in size and structure. Venous: The inferior vena cava is normal in size with greater than 50% respiratory variability, suggesting right atrial pressure of 3 mmHg. IAS/Shunts: No atrial level shunt detected by color flow Doppler.  LEFT VENTRICLE PLAX 2D LVIDd:         6.03 cm  Diastology LVIDs:         5.45 cm  LV e' medial:  7.40 cm/s LV PW:         1.14 cm  LV e' lateral: 8.92  cm/s LV IVS:        0.99 cm LVOT diam:  2.00 cm LVOT Area:     3.14 cm  RIGHT VENTRICLE RV Mid diam:    2.68 cm RV S prime:     12.40 cm/s TAPSE (M-mode): 1.6 cm LEFT ATRIUM              Index       RIGHT ATRIUM           Index LA diam:        5.00 cm  1.98 cm/m  RA Area:     13.50 cm LA Vol (A2C):   85.3 ml  33.86 ml/m RA Volume:   27.40 ml  10.88 ml/m LA Vol (A4C):   115.0 ml 45.65 ml/m LA Biplane Vol: 102.0 ml 40.49 ml/m  AORTIC VALVE                PULMONIC VALVE AV Area (Vmax): 2.50 cm    PV Vmax:       0.95 m/s AV Vmax:        119.00 cm/s PV Peak grad:  3.6 mmHg AV Peak Grad:   5.7 mmHg LVOT Vmax:      94.70 cm/s  AORTA Ao Root diam: 2.70 cm TRICUSPID VALVE TR Peak grad:   20.2 mmHg TR Vmax:        225.00 cm/s  SHUNTS Systemic Diam: 2.00 cm Marcina Millard MD Electronically signed by Marcina Millard MD Signature Date/Time: 05/17/2020/2:27:44 PM    Final    ECHOCARDIOGRAM LIMITED  Result Date: 05/18/2020    ECHOCARDIOGRAM LIMITED REPORT   Patient Name:   [name] Date of Exam: 05/18/2020 Medical Rec #:  [id number]                 Height:       71.0 in Accession #:    [id number]                Weight:       302.2 lb Date of Birth:  April 06, 1975                 BSA:          2.513 m Patient Age:    44 years                  BP:           129/94 mmHg Patient Gender: M                         HR:           98 bpm. Exam Location:  [facility] Procedure: Limited Echo and Intracardiac Opacification Agent Indications:     Atrial Fibrillation  History:         Patient has prior history of Echocardiogram examinations. Risk                  Factors:Morbid Obesity.  Sonographer:     L Thornton-Maynard Referring Phys:  [id number] Dois Davenport BERGE Diagnosing Phys: Marcina Millard MD  Sonographer Comments: Image acquisition challenging due to patient body habitus. IMPRESSIONS  1. Left ventricular ejection fraction, by estimation, is 30 to 35%. The left ventricle has moderately decreased  function. The left ventricle demonstrates global hypokinesis. The left ventricular internal cavity size was moderately dilated.  2. Right ventricular systolic function is normal. The right ventricular size is normal.  3. The mitral valve is normal in structure. No evidence of mitral valve regurgitation. No evidence  of mitral stenosis.  4. The aortic valve is normal in structure. Aortic valve regurgitation is not visualized. No aortic stenosis is present.  5. The inferior vena cava is normal in size with greater than 50% respiratory variability, suggesting right atrial pressure of 3 mmHg. FINDINGS  Left Ventricle: Left ventricular ejection fraction, by estimation, is 30 to 35%. The left ventricle has moderately decreased function. The left ventricle demonstrates global hypokinesis. Definity contrast agent was given IV to delineate the left ventricular endocardial borders. The left ventricular internal cavity size was moderately dilated. There is no left ventricular hypertrophy. Right Ventricle: The right ventricular size is normal. No increase in right ventricular wall thickness. Right ventricular systolic function is normal. Left Atrium: Left atrial size was normal in size. Right Atrium: Right atrial size was normal in size. Pericardium: There is no evidence of pericardial effusion. Mitral Valve: The mitral valve is normal in structure. No evidence of mitral valve stenosis. Tricuspid Valve: The tricuspid valve is normal in structure. Tricuspid valve regurgitation is not demonstrated. No evidence of tricuspid stenosis. Aortic Valve: The aortic valve is normal in structure. Aortic valve regurgitation is not visualized. No aortic stenosis is present. Pulmonic Valve: The pulmonic valve was normal in structure. Pulmonic valve regurgitation is not visualized. No evidence of pulmonic stenosis. Aorta: The aortic root is normal in size and structure. Venous: The inferior vena cava is normal in size with greater than 50%  respiratory variability, suggesting right atrial pressure of 3 mmHg. IAS/Shunts: No atrial level shunt detected by color flow Doppler. LEFT VENTRICLE PLAX 2D LVIDd:         6.42 cm LVIDs:         5.45 cm LV PW:         0.94 cm LV IVS:        1.00 cm  LV Volumes (MOD) LV vol d, MOD A2C: 152.0 ml LV vol d, MOD A4C: 209.0 ml LV vol s, MOD A2C: 104.0 ml LV vol s, MOD A4C: 138.0 ml LV SV MOD A2C:     48.0 ml LV SV MOD A4C:     209.0 ml LV SV MOD BP:      57.2 ml Marcina Millard MD Electronically signed by Marcina Millard MD Signature Date/Time: 05/18/2020/1:38:53 PM    Final     Cardiac Studies   Echo 05/17/20  1. Left ventricular ejection fraction, by estimation, is 25 to 30%. The  left ventricle has severely decreased function. The left ventricle has no  regional wall motion abnormalities. The left ventricular internal cavity  size was mildly to moderately  dilated. Left ventricular diastolic parameters are indeterminate.  2. Right ventricular systolic function is normal. The right ventricular  size is normal.  3. The mitral valve is normal in structure. Mild to moderate mitral valve  regurgitation. No evidence of mitral stenosis.  4. The aortic valve is normal in structure. Aortic valve regurgitation is  not visualized. No aortic stenosis is present.  5. The inferior vena cava is normal in size with greater than 50%  respiratory variability, suggesting right atrial pressure of 3 mmHg.   Limited echo 05/18/20 1. Left ventricular ejection fraction, by estimation, is 30 to 35%. The  left ventricle has moderately decreased function. The left ventricle  demonstrates global hypokinesis. The left ventricular internal cavity size  was moderately dilated.  2. Right ventricular systolic function is normal. The right ventricular  size is normal.  3. The mitral valve is normal in structure.  No evidence of mitral valve  regurgitation. No evidence of mitral stenosis.  4. The aortic valve is  normal in structure. Aortic valve regurgitation is  not visualized. No aortic stenosis is present.  5. The inferior vena cava is normal in size with greater than 50%  respiratory variability, suggesting right atrial pressure of 3 mmHg.   Patient Profile     45 y.o. male with pmh of morbid obesity, tobacco/marijuana/ETOH abuse who is being seen for Afib, acute CHF with new CM.   Assessment & Plan    Afib with RVR - presented with 1 month of DOE, orthopnea, abdominal bloating found to be in Afib and acute heart failure. EF came back reduced - Rates well controlled with diltiazem>>stopped for reduced EF - started on metoprolol 50mg  Q6hours>>wil consolidate to 100mg  BID - started on IV digoxin> 0.125mg  daily. Dig level 0.7 - continue IV heparin for CHADSVASC at least 2 (CHF, HTN). He will need cardiac cath for low EF. Can transition to a/c post- cath. - Patient remains in rate controlled Afib, can plan for TEE/DCCV, will discuss timing of procedure/procedures with MD  Acute systolic HF (EF 25-30%) - new dx per echo this admission. Limited echo yesterday showed EF 30-35% - IV lasix 60mg  BID>increased to TID - continue BB  - Entresto 24-26mg BID - Digoxin 0.125mg  daily - UOP -5.8L. Weight 304lbs>293lbs.  - He is able to lay flat on exam, do not appreciated JVD or significant LLE. Will check a BNP.  Can possibly transition to PO lasix - CMET pending. - Given that he can lay flat likely cath tomorrow  HTN - continue metoprolol, entresto - pressures improved  Alcohol abuse - reported 4 beverages 2-5 times a week - cessation encouraged  Tobacco and marijuana use - cessation advised  Overnight pauses - might need sleep study as OP  Abnormal LFTs - possible from alcohol use vs CHF - trending down - work-up per IM, plan for OP follow-up   For questions or updates, please contact [facility] Please consult [email or web address] for contact info under      Signed, Cadence ,  PA-C  05/19/2020, 8:26 AM    I have independently seen and examined the patient and agree with the findings and plan, as documented in the PA/NP's note, with the following additions/changes.  Patient continues to feel better and is almost back to his baseline in regard to breathing.  He denies chest pain and shortness of breath as well as palpitations and lightheadedness.  He is eager to move forward with cardiac procedures including catheterization and TEE guided cardioversion.  Physical exam is notable for morbidly obese man seated comfortably in bed.  Heart sounds are distant and irregularly irregular.  Breath sounds are mildly diminished throughout.  There is trace pretibial edema.  JVP difficult to assess due to body habitus.  Telemetry shows atrial fibrillation with ventricular rates of 90 to 110 bpm with occasional PVCs versus aberrancy.  Labs are notable for potassium 4.4, creatinine 1.2, hemoglobin 15.6, platelets 373.  I suspect that cardiomyopathy is most likely related to tachycardia in the setting of atrial fibrillation with rapid ventricular response prior to this admission.  Plans have already been made for right and left heart catheterization prior to TEE guided cardioversion, which is reasonable.  I have discussed these procedures with Mr. and, including potential risks and benefits.  He wishes to proceed.  We will proceed with right and left heart catheterization with possible PCI tomorrow and  likely TEE guided cardioversion on Wednesday.  For now, IV heparin should be continued; this can hopefully be transitioned to a NOAC on Wednesday if there are no bleeding/vascular complications.  We will continue with furosemide 60 mg IV 3 times daily for ongoing diuresis as well as rate control with metoprolol tartrate 100 mg twice daily and digoxin 125 mcg daily.  Hopefully, digoxin can be discontinued HFpEF cardioversion.  Sherryll Burger will be continued as well, which can hopefully be escalated  as blood pressure tolerates on an outpatient basis.  Approximately 40 minutes were spent on this encounter, of which greater than 50% was devoted to counseling the patient and his family.  Yvonne Kendall, MD [facility]

## 2020-05-19 NOTE — Plan of Care (Signed)
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
## 2020-05-20 ENCOUNTER — Encounter
Admission: EM | Disposition: A | Discharge status: Home / Self Care | Payer: Self-pay | Source: Home / Self Care | Attending: Internal Medicine

## 2020-05-20 DIAGNOSIS — I4819 Other persistent atrial fibrillation: Secondary | ICD-10-CM

## 2020-05-20 DIAGNOSIS — F1020 Alcohol dependence, uncomplicated: Secondary | ICD-10-CM | POA: Diagnosis not present

## 2020-05-20 DIAGNOSIS — I5021 Acute systolic (congestive) heart failure: Secondary | ICD-10-CM | POA: Diagnosis not present

## 2020-05-20 DIAGNOSIS — I2510 Atherosclerotic heart disease of native coronary artery without angina pectoris: Secondary | ICD-10-CM

## 2020-05-20 DIAGNOSIS — I4891 Unspecified atrial fibrillation: Secondary | ICD-10-CM | POA: Diagnosis not present

## 2020-05-20 HISTORY — PX: RIGHT/LEFT HEART CATH AND CORONARY ANGIOGRAPHY: CATH118266

## 2020-05-20 LAB — CBC
HCT: 48.1 % (ref 39.0–52.0)
Hemoglobin: 16.4 g/dL (ref 13.0–17.0)
MCH: 32.9 pg (ref 26.0–34.0)
MCHC: 34.1 g/dL (ref 30.0–36.0)
MCV: 96.4 fL (ref 80.0–100.0)
Platelets: 381 10*3/uL (ref 150–400)
RBC: 4.99 MIL/uL (ref 4.22–5.81)
RDW: 12.8 % (ref 11.5–15.5)
WBC: 11.4 10*3/uL — ABNORMAL HIGH (ref 4.0–10.5)
nRBC: 0 % (ref 0.0–0.2)

## 2020-05-20 LAB — HEPARIN LEVEL (UNFRACTIONATED)
Heparin Unfractionated: 0.5 IU/mL (ref 0.30–0.70)
Heparin Unfractionated: >1.1 IU/mL — ABNORMAL HIGH (ref 0.30–0.70)

## 2020-05-20 SURGERY — RIGHT/LEFT HEART CATH AND CORONARY ANGIOGRAPHY
Anesthesia: Moderate Sedation

## 2020-05-20 MED ORDER — FUROSEMIDE 10 MG/ML IJ SOLN
80.0000 mg | Freq: Two times a day (BID) | INTRAMUSCULAR | Status: DC
  Administered 2020-05-20: 80 mg via INTRAVENOUS
  Filled 2020-05-20 (×2): qty 8

## 2020-05-20 MED ORDER — MIDAZOLAM HCL 2 MG/2ML IJ SOLN
INTRAMUSCULAR | Status: DC | PRN
  Administered 2020-05-20: 1 mg via INTRAVENOUS

## 2020-05-20 MED ORDER — HEPARIN (PORCINE) 25000 UT/250ML-% IV SOLN
2800.0000 [IU]/h | INTRAVENOUS | Status: DC
  Administered 2020-05-20 – 2020-05-21 (×3): 2800 [IU]/h via INTRAVENOUS
  Filled 2020-05-20 (×2): qty 250

## 2020-05-20 MED ORDER — SODIUM CHLORIDE 0.9% FLUSH
3.0000 mL | Freq: Two times a day (BID) | INTRAVENOUS | Status: DC
  Administered 2020-05-21 – 2020-05-22 (×2): 3 mL via INTRAVENOUS

## 2020-05-20 MED ORDER — SPIRONOLACTONE 25 MG PO TABS
12.5000 mg | ORAL_TABLET | Freq: Every day | ORAL | Status: DC
  Administered 2020-05-20 – 2020-05-22 (×3): 12.5 mg via ORAL
  Filled 2020-05-20 (×2): qty 0.5
  Filled 2020-05-20 (×2): qty 1
  Filled 2020-05-20: qty 0.5

## 2020-05-20 MED ORDER — IOHEXOL 300 MG/ML  SOLN
INTRAMUSCULAR | Status: DC | PRN
  Administered 2020-05-20: 79 mL

## 2020-05-20 MED ORDER — HYDRALAZINE HCL 20 MG/ML IJ SOLN: 10.0000 mg | INTRAMUSCULAR | Status: AC | PRN

## 2020-05-20 MED ORDER — MIDAZOLAM HCL 2 MG/2ML IJ SOLN
INTRAMUSCULAR | Status: AC
  Filled 2020-05-20: qty 2

## 2020-05-20 MED ORDER — HEPARIN (PORCINE) IN NACL 2000-0.9 UNIT/L-% IV SOLN
INTRAVENOUS | Status: DC | PRN
  Administered 2020-05-20: 1000 mL

## 2020-05-20 MED ORDER — HEPARIN SODIUM (PORCINE) 1000 UNIT/ML IJ SOLN
INTRAMUSCULAR | Status: AC
  Filled 2020-05-20: qty 1

## 2020-05-20 MED ORDER — VERAPAMIL HCL 2.5 MG/ML IV SOLN
INTRAVENOUS | Status: AC
  Filled 2020-05-20: qty 2

## 2020-05-20 MED ORDER — NITROGLYCERIN 1 MG/10 ML FOR IR/CATH LAB
INTRA_ARTERIAL | Status: DC | PRN
  Administered 2020-05-20 (×3): 200 ug via INTRACORONARY

## 2020-05-20 MED ORDER — SODIUM CHLORIDE 0.9 % IV SOLN: 250.0000 mL | INTRAVENOUS | Status: DC | PRN

## 2020-05-20 MED ORDER — LIDOCAINE HCL (PF) 1 % IJ SOLN
INTRAMUSCULAR | Status: AC
  Filled 2020-05-20: qty 30

## 2020-05-20 MED ORDER — FENTANYL CITRATE (PF) 100 MCG/2ML IJ SOLN
INTRAMUSCULAR | Status: DC | PRN
  Administered 2020-05-20: 25 ug via INTRAVENOUS

## 2020-05-20 MED ORDER — SODIUM CHLORIDE 0.9% FLUSH: 3.0000 mL | INTRAVENOUS | Status: DC | PRN

## 2020-05-20 MED ORDER — VERAPAMIL HCL 2.5 MG/ML IV SOLN
INTRAVENOUS | Status: DC | PRN
  Administered 2020-05-20 (×2): 2.5 mg via INTRA_ARTERIAL

## 2020-05-20 MED ORDER — HEPARIN SODIUM (PORCINE) 1000 UNIT/ML IJ SOLN
INTRAMUSCULAR | Status: DC | PRN
Start: 2020-05-20 — End: 2020-05-20
  Administered 2020-05-20: 5000 [IU] via INTRAVENOUS

## 2020-05-20 MED ORDER — NITROGLYCERIN 1 MG/10 ML FOR IR/CATH LAB
INTRA_ARTERIAL | Status: AC
  Filled 2020-05-20: qty 10

## 2020-05-20 MED ORDER — FENTANYL CITRATE (PF) 100 MCG/2ML IJ SOLN
INTRAMUSCULAR | Status: AC
  Filled 2020-05-20: qty 2

## 2020-05-20 MED ORDER — LIDOCAINE HCL (PF) 1 % IJ SOLN
INTRAMUSCULAR | Status: DC | PRN
  Administered 2020-05-20: 5 mL

## 2020-05-20 MED ORDER — HEPARIN (PORCINE) IN NACL 1000-0.9 UT/500ML-% IV SOLN
INTRAVENOUS | Status: AC
  Filled 2020-05-20: qty 1000

## 2020-05-20 SURGICAL SUPPLY — 13 items
CATH INFINITI 5FR JK (CATHETERS) ×1 IMPLANT
CATH INFINITI JR4 5F (CATHETERS) ×1 IMPLANT
CATH SWAN GANZ 7F STRAIGHT (CATHETERS) ×1 IMPLANT
DEVICE RAD TR BAND REGULAR (VASCULAR PRODUCTS) ×1 IMPLANT
DRAPE BRACHIAL (DRAPES) ×2 IMPLANT
GLIDESHEATH SLEND SS 6F .021 (SHEATH) ×1 IMPLANT
GLIDESHEATH SLENDER 7FR .021G (SHEATH) ×1 IMPLANT
GUIDEWIRE INQWIRE 1.5J.035X260 (WIRE) IMPLANT
INQWIRE 1.5J .035X260CM (WIRE) ×2
PACK CARDIAC CATH (CUSTOM PROCEDURE TRAY) ×2 IMPLANT
PROTECTION STATION PRESSURIZED (MISCELLANEOUS) ×2
SET ATX SIMPLICITY (MISCELLANEOUS) ×1 IMPLANT
STATION PROTECTION PRESSURIZED (MISCELLANEOUS) IMPLANT

## 2020-05-20 NOTE — Progress Notes (Signed)
Progress Note    [name] [place] [name]  UXL:[id number] DOB: [birth year]  DOA: 05/16/2020 PCP: Pcp, No      Brief Narrative:    Medical records reviewed and are as summarized below:  Samuel [name] is a 45 y.o. male with medical history significant for morbid obesity, alcohol use disorder, tobacco use disorder, was referred from the gastroenterology office because of rapid heart rate and irregular heart rhythm.  He complained of epigastric pain, abdominal distention, palpitations and shortness of breath.  He was found to have hypertensive urgency, acute systolic CHF and atrial fibrillation with RVR with heart rate in the 160s.  He was treated with IV Cardizem infusion, IV heparin drip and IV Lasix.  2D echo showed EF estimated at 25 to 30%.  Cardiologist was consulted to assist with management.  He underwent left heart cath which showed mild nonobstructive CAD.  He also has fatty liver and cholelithiasis and outpatient follow-up was recommended.  He has been advised to avoid alcohol and cigarette smoking.   Assessment/Plan:   Principal Problem:   Atrial fibrillation with rapid ventricular response (HCC) Active Problems:   Alcohol use disorder, moderate, dependence (HCC)   Morbid obesity (HCC)   Cholelithiasis without obstruction   Epigastric pain   NSAID long-term use   Acute HFrEF (heart failure with reduced ejection fraction) (HCC)   Cardiomyopathy (HCC)   Body mass index is 40.61 kg/m.  (Morbid obesity)   Atrial fibrillation with RVR: Continue metoprolol and digoxin for rate control.  Continue IV heparin infusion and monitor heparin level per protocol.  Plan for TEE and cardioversion tomorrow.  Follow-up with cardiologist.    Nonischemic cardiomyopathy, acute systolic CHF: Continue IV Lasix.  S/p left heart cath on 05/20/2020.  Findings are as follows.   FINDINGS: 1. Mild, non-obstructive CAD with 20-30% ostial LAD stenosis and vasospasm of the  ostial/proximal RCA. 2. Mildly elevated left heart filling pressure. 3. Moderately to severely elevated right heart filling pressure. 4. Moderately reduced cardiac output/index.  Repeat 2D echo showed EF estimated at 30 to 35%.  Previous 2D echo showed EF estimated at 25 to 30%.  Alcohol and tobacco use disorder: Advised to quit drinking alcohol and to stop smoking cigarettes.  Hypertension: Continue antihypertensives  Cholelithiasis, hepatic steatosis, elevated liver enzymes: Liver enzymes are trending down.  Outpatient follow-up  Plan discussed with his father and mother at the bedside   Diet Order            Diet NPO time specified Except for: Sips with Meds  Diet effective midnight                    Consultants:  Cardiologist  Procedures:  None    Medications:   . digoxin  0.125 mg Oral Daily  . furosemide  60 mg Intravenous TID AC  . metoprolol tartrate  100 mg Oral BID  . pantoprazole  40 mg Oral Daily  . sacubitril-valsartan  1 tablet Oral BID  . sodium chloride flush  3 mL Intravenous Q12H  . sucralfate  1 g Oral TID WC & HS   Continuous Infusions: . sodium chloride    . sodium chloride 10 mL/hr at 05/20/20 0509  . heparin 2,800 Units/hr (05/20/20 0438)     Anti-infectives (From admission, onward)   None             Family Communication/Anticipated D/C date and plan/Code Status   DVT prophylaxis:  Code Status: Full Code  Family Communication: Father and mother at the bedside Disposition Plan:    Status is: Inpatient  Remains inpatient appropriate because:IV treatments appropriate due to intensity of illness or inability to take PO   Dispo: The patient is from: Home              Anticipated d/c is to: Home              Patient currently is not medically stable to d/c.   Difficult to place patient No           Subjective:   Interval events noted.  No chest pain or shortness of breath.  He feels a little  better today.  Objective:    Vitals:   05/20/20 0253 05/20/20 0500 05/20/20 0741 05/20/20 0746  BP: 131/75  126/84   Pulse: 68  (!) 47 62  Resp: 19  18   Temp: 98.1 F (36.7 C)  97.9 F (36.6 C)   TempSrc: Oral  Oral   SpO2: 99%  98%   Weight:  132.1 kg    Height:       No data found.   Intake/Output Summary (Last 24 hours) at 05/20/2020 0935 Last data filed at 05/20/2020 0509 Gross per 24 hour  Intake 1188.82 ml  Output 3950 ml  Net -2761.18 ml   Filed Weights   05/19/20 1357 05/19/20 1922 05/20/20 0500  Weight: 133.1 kg 132.9 kg 132.1 kg    Exam:  GEN: NAD SKIN: No rash EYES: EOMI ENT: MMM CV: Irregular rate and rhythm, tachycardic PULM: CTA B ABD: soft,obese, NT, +BS CNS: AAO x 3, chronic mild weakness of right temporal fingers EXT: No edema or tenderness          Data Reviewed:   I have personally reviewed following labs and imaging studies:  Labs: Labs show the following:   Basic Metabolic Panel: Recent Labs  Lab 05/16/20 1522 05/17/20 1845 05/18/20 0514 05/19/20 1105  NA 139  --  139 139  K 3.8  --  3.8 4.4  CL 106  --  105 102  CO2 22  --  24 27  GLUCOSE 114*  --  130* 123*  BUN 17  --  18 21*  CREATININE 1.11  --  1.07 1.20  CALCIUM 9.0  --  9.0 9.6  MG  --  2.3 2.2  --    GFR Estimated Creatinine Clearance: 107.8 mL/min (by C-G formula based on SCr of 1.2 mg/dL). Liver Function Tests: Recent Labs  Lab 05/16/20 1522 05/18/20 0514 05/19/20 1105  AST 60* 46* 44*  ALT 119* 104* 101*  ALKPHOS 57 47 51  BILITOT 1.7* 1.1 1.2  PROT 8.0 7.4 8.3*  ALBUMIN 4.6 4.1 4.5   Recent Labs  Lab 05/16/20 1522  LIPASE 28   No results for input(s): AMMONIA in the last 168 hours. Coagulation profile Recent Labs  Lab 05/16/20 1826  INR 1.1    CBC: Recent Labs  Lab 05/16/20 1522 05/18/20 0514 05/19/20 0208 05/20/20 0413  WBC 12.6* 11.5* 12.5* 11.4*  HGB 14.6 13.9 15.6 16.4  HCT 43.1 41.6 44.7 48.1  MCV 95.8 97.2 94.9 96.4   PLT 351 350 373 381   Cardiac Enzymes: No results for input(s): CKTOTAL, CKMB, CKMBINDEX, TROPONINI in the last 168 hours. BNP (last 3 results) No results for input(s): PROBNP in the last 8760 hours. CBG: No results for input(s): GLUCAP in the last 168 hours. D-Dimer: No  results for input(s): DDIMER in the last 72 hours. Hgb A1c: No results for input(s): HGBA1C in the last 72 hours. Lipid Profile: No results for input(s): CHOL, HDL, LDLCALC, TRIG, CHOLHDL, LDLDIRECT in the last 72 hours. Thyroid function studies: No results for input(s): TSH, T4TOTAL, T3FREE, THYROIDAB in the last 72 hours.  Invalid input(s): FREET3 Anemia work up: No results for input(s): VITAMINB12, FOLATE, FERRITIN, TIBC, IRON, RETICCTPCT in the last 72 hours. Sepsis Labs: Recent Labs  Lab 05/16/20 1522 05/18/20 0514 05/19/20 0208 05/20/20 0413  WBC 12.6* 11.5* 12.5* 11.4*    Microbiology Recent Results (from the past 240 hour(s))  Resp Panel by RT-PCR (Flu A&B, Covid) Nasopharyngeal Swab     Status: None   Collection Time: 05/16/20  6:26 PM   Specimen: Nasopharyngeal Swab; Nasopharyngeal(NP) swabs in vial transport medium  Result Value Ref Range Status   SARS Coronavirus 2 by RT PCR NEGATIVE NEGATIVE Final    Comment: (NOTE) SARS-CoV-2 target nucleic acids are NOT DETECTED.  The SARS-CoV-2 RNA is generally detectable in upper respiratory specimens during the acute phase of infection. The lowest concentration of SARS-CoV-2 viral copies this assay can detect is 138 copies/mL. A negative result does not preclude SARS-Cov-2 infection and should not be used as the sole basis for treatment or other patient management decisions. A negative result may occur with  improper specimen collection/handling, submission of specimen other than nasopharyngeal swab, presence of viral mutation(s) within the areas targeted by this assay, and inadequate number of viral copies(<138 copies/mL). A negative result must  be combined with clinical observations, patient history, and epidemiological information. The expected result is Negative.  Fact Sheet for Patients:  BloggerCourse.com  Fact Sheet for Healthcare Providers:  SeriousBroker.it  This test is no t yet approved or cleared by the [place] FDA and  has been authorized for detection and/or diagnosis of SARS-CoV-2 by FDA under an Emergency Use Authorization (EUA). This EUA will remain  in effect (meaning this test can be used) for the duration of the COVID-19 declaration under Section 564(b)(1) of the Act, 21 U.S.C.section 360bbb-3(b)(1), unless the authorization is terminated  or revoked sooner.       Influenza A by PCR NEGATIVE NEGATIVE Final   Influenza B by PCR NEGATIVE NEGATIVE Final    Comment: (NOTE) The Xpert Xpress SARS-CoV-2/FLU/RSV plus assay is intended as an aid in the diagnosis of influenza from Nasopharyngeal swab specimens and should not be used as a sole basis for treatment. Nasal washings and aspirates are unacceptable for Xpert Xpress SARS-CoV-2/FLU/RSV testing.  Fact Sheet for Patients: BloggerCourse.com  Fact Sheet for Healthcare Providers: SeriousBroker.it  This test is not yet approved or cleared by the [place] FDA and has been authorized for detection and/or diagnosis of SARS-CoV-2 by FDA under an Emergency Use Authorization (EUA). This EUA will remain in effect (meaning this test can be used) for the duration of the COVID-19 declaration under Section 564(b)(1) of the Act, 21 U.S.C. section 360bbb-3(b)(1), unless the authorization is terminated or revoked.  Performed at [facility], [address], [place], [place] [address]     Procedures and diagnostic studies:  ECHOCARDIOGRAM LIMITED  Result Date: 05/18/2020    ECHOCARDIOGRAM LIMITED REPORT   Patient Name:   Samuel [name]  [name] Date of Exam: 05/18/2020 Medical Rec #:  [id number]                 Height:       71.0 in Accession #:  [id number]                Weight:       302.2 lb Date of Birth:  [birth year]                 BSA:          2.513 m Patient Age:    44 years                  BP:           129/94 mmHg Patient Gender: M                         HR:           98 bpm. Exam Location:  [facility] Procedure: Limited Echo and Intracardiac Opacification Agent Indications:     Atrial Fibrillation  History:         Patient has prior history of Echocardiogram examinations. Risk                  Factors:Morbid Obesity.  Sonographer:     L Thornton-Maynard Referring Phys:  [id number]port BERGE Diagnosing Phys: Marcina Millard MD  Sonographer Comments: Image acquisition challenging due to patient body habitus. IMPRESSIONS  1. Left ventricular ejection fraction, by estimation, is 30 to 35%. The left ventricle has moderately decreased function. The left ventricle demonstrates global hypokinesis. The left ventricular internal cavity size was moderately dilated.  2. Right ventricular systolic function is normal. The right ventricular size is normal.  3. The mitral valve is normal in structure. No evidence of mitral valve regurgitation. No evidence of mitral stenosis.  4. The aortic valve is normal in structure. Aortic valve regurgitation is not visualized. No aortic stenosis is present.  5. The inferior vena cava is normal in size with greater than 50% respiratory variability, suggesting right atrial pressure of 3 mmHg. FINDINGS  Left Ventricle: Left ventricular ejection fraction, by estimation, is 30 to 35%. The left ventricle has moderately decreased function. The left ventricle demonstrates global hypokinesis. Definity contrast agent was given IV to delineate the left ventricular endocardial borders. The left ventricular internal cavity size was moderately dilated. There is no left ventricular hypertrophy. Right Ventricle: The right  ventricular size is normal. No increase in right ventricular wall thickness. Right ventricular systolic function is normal. Left Atrium: Left atrial size was normal in size. Right Atrium: Right atrial size was normal in size. Pericardium: There is no evidence of pericardial effusion. Mitral Valve: The mitral valve is normal in structure. No evidence of mitral valve stenosis. Tricuspid Valve: The tricuspid valve is normal in structure. Tricuspid valve regurgitation is not demonstrated. No evidence of tricuspid stenosis. Aortic Valve: The aortic valve is normal in structure. Aortic valve regurgitation is not visualized. No aortic stenosis is present. Pulmonic Valve: The pulmonic valve was normal in structure. Pulmonic valve regurgitation is not visualized. No evidence of pulmonic stenosis. Aorta: The aortic root is normal in size and structure. Venous: The inferior vena cava is normal in size with greater than 50% respiratory variability, suggesting right atrial pressure of 3 mmHg. IAS/Shunts: No atrial level shunt detected by color flow Doppler. LEFT VENTRICLE PLAX 2D LVIDd:         6.42 cm LVIDs:         5.45 cm LV PW:         0.94 cm LV IVS:  1.00 cm  LV Volumes (MOD) LV vol d, MOD A2C: 152.0 ml LV vol d, MOD A4C: 209.0 ml LV vol s, MOD A2C: 104.0 ml LV vol s, MOD A4C: 138.0 ml LV SV MOD A2C:     48.0 ml LV SV MOD A4C:     209.0 ml LV SV MOD BP:      57.2 ml Marcina Millard MD Electronically signed by Marcina Millard MD Signature Date/Time: 05/18/2020/1:38:53 PM    Final                LOS: 4 days   Romesha Scherer  [facility]   Pager on [email or web address]. If 7PM-7AM, please contact night-coverage at [email or web address]     05/20/2020, 9:35 AM

## 2020-05-20 NOTE — Brief Op Note (Signed)
BRIEF CARDIAC CATHETERIZATION NOTE  05/20/2020  11:39 AM  PATIENT:  Samuel [name]  45 y.o. male  PRE-OPERATIVE DIAGNOSIS:  Acute HFrEF  POST-OPERATIVE DIAGNOSIS:  NICM  PROCEDURE:  Procedure(s): RIGHT/LEFT HEART CATH AND CORONARY ANGIOGRAPHY (N/A)  SURGEON:  Surgeon(s) and Role:    * Vincenzina Jagoda, MD - Primary  FINDINGS: 1. Mild, non-obstructive CAD with 20-30% ostial LAD stenosis and vasospasm of the ostial/proximal RCA. 2. Mildly elevated left heart filling pressure. 3. Moderately to severely elevated right heart filling pressure. 4. Moderately reduced cardiac output/index.  RECOMMENDATIONS: 1. Continue diuresis. 2. Restart IV heparin 2 hours after TR band removal. 3. TEE/DCCV tomorrow. 4. Escalate GDMT for NICM as tolerated.  Yvonne Kendall, MD [facility]

## 2020-05-20 NOTE — Interval H&P Note (Signed)
History and Physical Interval Note:  05/20/2020 10:19 AM  Samuel [name]  has presented today for surgery, with the diagnosis of cardiomypathy.  The various methods of treatment have been discussed with the patient and family. After consideration of risks, benefits and other options for treatment, the patient has consented to right and left heart catheterization with possible coronary intervention as a surgical intervention.  The patient's history has been reviewed, patient examined, no change in status, stable for surgery.  I have reviewed the patient's chart and labs.  Questions were answered to the patient's satisfaction.    Cath Lab Visit (complete for each Cath Lab visit)  Clinical Evaluation Leading to the Procedure:   ACS: No.  Non-ACS:    Anginal/Heart Failure Classification: NYHA class IV  Anti-ischemic medical therapy: Minimal Therapy (1 class of medications)  Non-Invasive Test Results: No non-invasive testing performed (LVEF 30-35% by echo -> high risk)  Prior CABG: No previous CABG  Samuel [name]

## 2020-05-20 NOTE — OR Nursing (Signed)
Dr end evaluated pt and changed procedure to right and left heart catheterization
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
## 2020-05-20 NOTE — Consult Note (Signed)
   Heart Failure Nurse Navigator Note  HFrEF 30-35%.  He presented after he was seen at the gastrointestinal office.  He had experienced 1 month of dyspnea on exertion, orthopnea and abdominal bloating.  Comorbidities:  Morbid obesity  Medications:  Digoxin 0.125 mg daily Metoprolol tartrate 100 mg 2 times a day Entresto 24/26 mg 2 times a day  Labs:  Sodium 139, potassium 4.4, chloride 102, CO2 27, BUN 21, creatinine 1.2, BNP 203 Intake 1188 mL Output 3950 mL Weight 132.1 kg down from 133.1 kg of yesterday. BMI is 40.6  Assessment:  General-she is awake and alert sitting on the edge of the bed in no acute distress.  HEENT-pupils are equal, no JVD.  Cardiac-heart tones are irregular.  Chest-breath sounds are clear to posterior auscultation.  Abdomen-rounded soft non tender  Musculoskeletal-there is no lower extremity edema.  Psych-is pleasant and appropriate makes good eye contact.  Neurologic- speech is clear    Initial meeting with patient.  Discussed heart failure, stressed the importance of daily weights and recording.  Also talked about weight gain and signs and symptoms to report to physician.  Also talked about low-sodium diet and eating in restaurants.  Stressed the importance of managing fluids daily to no more than 8 cups.  States that he likes to work in the garden and be outside, recommended if he feels that he is perspiring a lot that he could increase his fluid intake.  He states that he likes to eat salmon, chicken and pork.    Also discussed the living with heart failure booklet, and handouts.    Made aware of his appointment in the heart failure clinic, given information along with the appointment time.  Will continue to follow along.  Tresa Endo RN [facility]
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
## 2020-05-20 NOTE — Plan of Care (Signed)

## 2020-05-20 NOTE — Progress Notes (Signed)
Progress Note  Patient Name: Samuel [name] Date of Encounter: 05/20/2020  [facility] Cardiologist: Debbe Odea, MD   Subjective   Patient feels well, denying chest pain, shortness of breath, palpitations, and lightheadedness.  Inpatient Medications    Scheduled Meds: . digoxin  0.125 mg Oral Daily  . furosemide  80 mg Intravenous BID  . metoprolol tartrate  100 mg Oral BID  . pantoprazole  40 mg Oral Daily  . sacubitril-valsartan  1 tablet Oral BID  . sodium chloride flush  3 mL Intravenous Q12H  . sodium chloride flush  3 mL Intravenous Q12H  . sucralfate  1 g Oral TID WC & HS   Continuous Infusions: . sodium chloride    . heparin 2,800 Units/hr (05/20/20 1527)   PRN Meds: sodium chloride, acetaminophen, hydrALAZINE, morphine injection, ondansetron (ZOFRAN) IV, sodium chloride flush, traMADol   Vital Signs    Vitals:   05/20/20 1315 05/20/20 1403 05/20/20 1500 05/20/20 1554  BP: 117/82   106/71  Pulse: 87 (!) 117 98 98  Resp: 19   20  Temp: 98.4 F (36.9 C)   98.4 F (36.9 C)  TempSrc: Oral   Oral  SpO2: 97%   98%  Weight:      Height:        Intake/Output Summary (Last 24 hours) at 05/20/2020 1754 Last data filed at 05/20/2020 1555 Gross per 24 hour  Intake 948.82 ml  Output 3125 ml  Net -2176.18 ml   Last 3 Weights 05/20/2020 05/19/2020 05/19/2020  Weight (lbs) 291 lb 3.2 oz 292 lb 15.9 oz 293 lb 8 oz  Weight (kg) 132.087 kg 132.9 kg 133.131 kg      Telemetry    Atrial fibrillation with ventricular rates of 80 to 130 bpm - Personally Reviewed  ECG    No new tracing.  Physical Exam   GEN: No acute distress.   Neck: No JVD Cardiac: RRR, no murmurs, rubs, or gallops.  Respiratory: Clear to auscultation bilaterally. GI: Soft, nontender, non-distended  MS: No edema; No deformity. Neuro:  Nonfocal  Psych: Normal affect   Labs    High Sensitivity Troponin:   Recent Labs  Lab 05/16/20 1826  TROPONINIHS 16       Chemistry Recent Labs  Lab 05/16/20 1522 05/18/20 0514 05/19/20 1105  NA 139 139 139  K 3.8 3.8 4.4  CL 106 105 102  CO2 22 24 27   GLUCOSE 114* 130* 123*  BUN 17 18 21*  CREATININE 1.11 1.07 1.20  CALCIUM 9.0 9.0 9.6  PROT 8.0 7.4 8.3*  ALBUMIN 4.6 4.1 4.5  AST 60* 46* 44*  ALT 119* 104* 101*  ALKPHOS 57 47 51  BILITOT 1.7* 1.1 1.2  GFRNONAA >60 >60 >60  ANIONGAP 11 10 10      Hematology Recent Labs  Lab 05/18/20 0514 05/19/20 0208 05/20/20 0413  WBC 11.5* 12.5* 11.4*  RBC 4.28 4.71 4.99  HGB 13.9 15.6 16.4  HCT 41.6 44.7 48.1  MCV 97.2 94.9 96.4  MCH 32.5 33.1 32.9  MCHC 33.4 34.9 34.1  RDW 13.2 12.8 12.8  PLT 350 373 381    BNP Recent Labs  Lab 05/19/20 1105  BNP 203.6*     DDimer No results for input(s): DDIMER in the last 168 hours.   Radiology    No results found.  Cardiac Studies   R/LHC (05/20/2020): 1. Mild, non-obstructive CAD with 20-30% ostial LAD stenosis and vasospasm of the ostial/proximal RCA. 2. Mildly  elevated left heart filling pressure. 3. Moderately to severely elevated right heart filling pressure. 4. Moderately reduced cardiac output/index.  Patient Profile     45 y.o. male with history of morbid obesity, tobacco/marijuana/EtOH use, admitted with acute HFrEF and atrial fibrillation with rapid ventricular response.  Assessment & Plan    Acute HFrEF: LVEF noted to be 25-35% by echo this admission.  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  Filling pressures were still somewhat elevated, right greater than the left, with moderately reduced cardiac output/index.  Continue metoprolol tartrate 100 mg twice daily, with plan to transition to metoprolol succinate following cardioversion and prior to discharge.  Continue Entresto with escalation as an outpatient as blood pressure  allows.  Continue digoxin.  Add spironolactone 12.5 mg daily.  Plan for TEE guided cardioversion tomorrow to reestablish sinus rhythm.  Transition furosemide from 60 mg IV 3 times daily to 80 mg IV twice daily.  Persistent atrial fibrillation: Ventricular rates still somewhat uncontrolled at times.  I suspect atrial fibrillation may be driving cardiomyopathy.  Continue IV heparin.  If no bleeding/vascular complications from today's catheterization noted, patient could be transition to a NOAC as soon as tomorrow.  Plan for TEE guided cardioversion tomorrow.  Continue current doses of metoprolol and digoxin for rate control.  Digoxin level reasonable yesterday.  Morbid obesity:  Weight loss encouraged through diet and exercise.  Consider sleep study given multiple risk factors for obstructive sleep apnea.  Shared Decision Making/Informed Consent The risks [stroke, cardiac arrhythmias rarely resulting in the need for a temporary or permanent pacemaker, skin irritation or burns, esophageal damage, perforation (1:10,000 risk), bleeding, pharyngeal hematoma as well as other potential complications associated with conscious sedation including aspiration, arrhythmia, respiratory failure and death], benefits (treatment guidance, restoration of normal sinus rhythm, diagnostic support) and alternatives of a transesophageal echocardiogram guided cardioversion were discussed in detail with Samuel [name] and he is willing to proceed.  For questions or updates, please contact [facility] Please consult [email or web address] for contact info under [facility].     Signed, Yvonne Kendall, MD  05/20/2020, 5:54 PM

## 2020-05-20 NOTE — TOC Progression Note (Signed)
Transition of Care [facility]) - Progression Note    Patient Details  Name: Samuel [name] MRN: [id number] Date of Birth: 1975-07-02  [facility] [place] CM/SW Contact  Hetty Ely, RN Phone Number: 05/20/2020, 1:26 PM  Clinical Narrative: Patient off floor in Cath Lab, no available for TOC assessment.           Expected Discharge Plan and Services                                                 Social Determinants of Health (SDOH) Interventions    Readmission Risk Interventions No flowsheet data found.

## 2020-05-21 ENCOUNTER — Encounter
Admission: EM | Disposition: A | Discharge status: Home / Self Care | Payer: Self-pay | Source: Home / Self Care | Attending: Internal Medicine

## 2020-05-21 ENCOUNTER — Inpatient Hospital Stay: Payer: BLUE CROSS/BLUE SHIELD | Admitting: Anesthesiology

## 2020-05-21 ENCOUNTER — Inpatient Hospital Stay
Admit: 2020-05-21 | Discharge: 2020-05-21 | Disposition: A | Discharge status: Home / Self Care | Payer: BLUE CROSS/BLUE SHIELD | Attending: Internal Medicine | Admitting: Internal Medicine

## 2020-05-21 ENCOUNTER — Encounter: Payer: Self-pay | Admitting: Internal Medicine

## 2020-05-21 DIAGNOSIS — I429 Cardiomyopathy, unspecified: Secondary | ICD-10-CM | POA: Diagnosis not present

## 2020-05-21 DIAGNOSIS — F1020 Alcohol dependence, uncomplicated: Secondary | ICD-10-CM | POA: Diagnosis not present

## 2020-05-21 DIAGNOSIS — I4819 Other persistent atrial fibrillation: Principal | ICD-10-CM

## 2020-05-21 DIAGNOSIS — I428 Other cardiomyopathies: Secondary | ICD-10-CM

## 2020-05-21 DIAGNOSIS — I5021 Acute systolic (congestive) heart failure: Secondary | ICD-10-CM | POA: Diagnosis not present

## 2020-05-21 DIAGNOSIS — I4891 Unspecified atrial fibrillation: Secondary | ICD-10-CM | POA: Diagnosis not present

## 2020-05-21 HISTORY — PX: TEE WITHOUT CARDIOVERSION: SHX5443

## 2020-05-21 HISTORY — PX: CARDIOVERSION: SHX1299

## 2020-05-21 LAB — BASIC METABOLIC PANEL
Anion gap: 10 (ref 5–15)
BUN: 25 mg/dL — ABNORMAL HIGH (ref 6–20)
CO2: 21 mmol/L — ABNORMAL LOW (ref 22–32)
Calcium: 9.4 mg/dL (ref 8.9–10.3)
Chloride: 108 mmol/L (ref 98–111)
Creatinine, Ser: 0.98 mg/dL (ref 0.61–1.24)
GFR, Estimated: >60 mL/min (ref >60)
Glucose, Bld: 113 mg/dL — ABNORMAL HIGH (ref 70–99)
Potassium: 4.1 mmol/L (ref 3.5–5.1)
Sodium: 139 mmol/L (ref 135–145)

## 2020-05-21 LAB — CBC
HCT: 50.9 % (ref 39.0–52.0)
Hemoglobin: 16.3 g/dL (ref 13.0–17.0)
MCH: 32.8 pg (ref 26.0–34.0)
MCHC: 32 g/dL (ref 30.0–36.0)
MCV: 102.4 fL — ABNORMAL HIGH (ref 80.0–100.0)
Platelets: 348 10*3/uL (ref 150–400)
RBC: 4.97 MIL/uL (ref 4.22–5.81)
RDW: 12.7 % (ref 11.5–15.5)
WBC: 11 10*3/uL — ABNORMAL HIGH (ref 4.0–10.5)
nRBC: 0 % (ref 0.0–0.2)

## 2020-05-21 LAB — HEPARIN LEVEL (UNFRACTIONATED)
Heparin Unfractionated: 0.65 IU/mL (ref 0.30–0.70)
Heparin Unfractionated: 0.67 IU/mL (ref 0.30–0.70)

## 2020-05-21 SURGERY — ECHOCARDIOGRAM, TRANSESOPHAGEAL
Anesthesia: General

## 2020-05-21 MED ORDER — PROPOFOL 10 MG/ML IV BOLUS
INTRAVENOUS | Status: DC | PRN
  Administered 2020-05-21: 110 mg via INTRAVENOUS
  Administered 2020-05-21: 100 mg via INTRAVENOUS
  Administered 2020-05-21: 20 mg via INTRAVENOUS

## 2020-05-21 MED ORDER — PROPOFOL 500 MG/50ML IV EMUL
INTRAVENOUS | Status: AC
  Filled 2020-05-21: qty 50

## 2020-05-21 MED ORDER — APIXABAN 5 MG PO TABS
5.0000 mg | ORAL_TABLET | Freq: Two times a day (BID) | ORAL | Status: DC
  Administered 2020-05-21 – 2020-05-22 (×3): 5 mg via ORAL
  Filled 2020-05-21 (×3): qty 1

## 2020-05-21 MED ORDER — FUROSEMIDE 10 MG/ML IJ SOLN
40.0000 mg | Freq: Two times a day (BID) | INTRAMUSCULAR | Status: DC
  Administered 2020-05-21 – 2020-05-22 (×2): 40 mg via INTRAVENOUS
  Filled 2020-05-21 (×2): qty 4

## 2020-05-21 MED ORDER — SUCCINYLCHOLINE CHLORIDE 200 MG/10ML IV SOSY
PREFILLED_SYRINGE | INTRAVENOUS | Status: AC
  Filled 2020-05-21: qty 10

## 2020-05-21 MED ORDER — EPHEDRINE 5 MG/ML INJ
INTRAVENOUS | Status: AC
  Filled 2020-05-21: qty 10

## 2020-05-21 MED ORDER — ATROPINE SULFATE 1 MG/10ML IJ SOSY
PREFILLED_SYRINGE | INTRAMUSCULAR | Status: AC
  Filled 2020-05-21: qty 10

## 2020-05-21 MED ORDER — ROCURONIUM BROMIDE 10 MG/ML (PF) SYRINGE
PREFILLED_SYRINGE | INTRAVENOUS | Status: AC
  Filled 2020-05-21: qty 10

## 2020-05-21 MED ORDER — SODIUM CHLORIDE 0.9 % IV SOLN: INTRAVENOUS | Status: DC

## 2020-05-21 MED ORDER — LIDOCAINE VISCOUS HCL 2 % MT SOLN
OROMUCOSAL | Status: AC
  Filled 2020-05-21: qty 15

## 2020-05-21 MED ORDER — EPINEPHRINE 1 MG/10ML IJ SOSY
PREFILLED_SYRINGE | INTRAMUSCULAR | Status: AC
  Filled 2020-05-21: qty 10

## 2020-05-21 MED ORDER — METOPROLOL SUCCINATE ER 100 MG PO TB24
100.0000 mg | ORAL_TABLET | Freq: Every day | ORAL | Status: DC
  Administered 2020-05-21 – 2020-05-22 (×2): 100 mg via ORAL
  Filled 2020-05-21 (×2): qty 1

## 2020-05-21 NOTE — Transfer of Care (Signed)
Immediate Anesthesia Transfer of Care Note  Patient: Samuel [name]  Procedure(s) Performed: TRANSESOPHAGEAL ECHOCARDIOGRAM (TEE) (N/A ) CARDIOVERSION (N/A )  Patient Location: Special Procedures  Anesthesia Type:General  Level of Consciousness: drowsy  Airway & Oxygen Therapy: Patient Spontanous Breathing and Patient connected to nasal cannula oxygen  Post-op Assessment: Report given to RN and Post -op Vital signs reviewed and stable  Post vital signs: Reviewed and stable  Last Vitals:  Vitals Value Taken Time  BP 109/94 05/21/20 1106  Temp    Pulse 69 05/21/20 1107  Resp 21 05/21/20 1107  SpO2 93 % 05/21/20 1107    Last Pain:  Vitals:   05/21/20 1003  TempSrc: Oral  PainSc: 0-No pain      Patients Stated Pain Goal: 0 (05/20/20 0800)  Complications: No complications documented.
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
## 2020-05-21 NOTE — Procedures (Signed)
Transesophageal Echocardiogram :  Indication: atrial fibrillation  Procedure: 10cc of viscous lidocaine were given orally to provide local anesthesia to the oropharynx. The patient was positioned supine on the left side, bite block provided. The patient was sedated with propofol per anesthesia.  Using digital technique an omniplane probe was advanced into the esophagus without incident.   See report in EPIC  for complete details: In brief, imaging revealed severely reduced ejection fraction, LVEF 25%, no mural apical thrombus.  .    Imaging of the septum showed no ASD or VSD 2D and color flow confirmed no PFO  The LA was well visualized in orthogonal views.  There was no thrombus in the LA and LA appendage   Cardioversion procedure note For atrial fibrillation.  Procedure Details:  Consent: Risks of procedure as well as the alternatives and risks of each were explained to the (patient/caregiver).  Consent for procedure obtained.  Time Out: Verified patient identification, verified procedure, site/side was marked, verified correct patient position, special equipment/implants available, medications/allergies/relevent history reviewed, required imaging and test results available.  Performed  Patient placed on cardiac monitor, pulse oximetry, supplemental oxygen as necessary.   Sedation given: propofol IV per anesthesia team  Pacer pads placed anterior and posterior chest.   Cardioverted 1 time(s).   Cardioverted at  200J. Synchronized biphasic Converted to NSR   Evaluation: Findings: Post procedure EKG shows: NSR Complications: None Patient did tolerate procedure well.  Time Spent Directly with the Patient:  35 minutes   Samuel [name], M.D.   Samuel [name] 05/21/2020 11:03 AM
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
## 2020-05-21 NOTE — Progress Notes (Addendum)
   Heart Failure Nurse Navigator Note  HFrEF 30-35%   Met with patient today for greater than 20 minutes discussed daily weights, the importance of reporting weight gain but also important to report  If he thinks he is losing too much weight.   Also discussed eating healthy, reading labels.  Patient states that he does use convenience frozen dinners advised him to be very careful and make sure that he reads labels as those dinners can contain anywhere from 400 mg up to 1800 mg and just in 1 serving.  Also discussed reading labels, even if it says it is low sodium. Talked about what to use for seasonings -what to avoid.  He was also given the  cookbook, The Low Sodium Dr. Miles Costain cookbook.    Have contacted TOC to supply patient with scale, as he does not have one at home.  Pricilla Riffle RN [facility]

## 2020-05-21 NOTE — Anesthesia Postprocedure Evaluation (Signed)
Anesthesia Post Note  Patient: Samuel [name]  Procedure(s) Performed: TRANSESOPHAGEAL ECHOCARDIOGRAM (TEE) (N/A ) CARDIOVERSION (N/A )  Patient location during evaluation: Specials Recovery Anesthesia Type: General Level of consciousness: awake and alert Pain management: pain level controlled Vital Signs Assessment: post-procedure vital signs reviewed and stable Respiratory status: spontaneous breathing, nonlabored ventilation, respiratory function stable and patient connected to nasal cannula oxygen Cardiovascular status: blood pressure returned to baseline and stable Postop Assessment: no apparent nausea or vomiting Anesthetic complications: no   No complications documented.   Last Vitals:  Vitals:   05/21/20 1130 05/21/20 1145  BP: 106/70 104/70  Pulse: 72 65  Resp: 16 18  Temp:    SpO2: 96% 96%    Last Pain:  Vitals:   05/21/20 1145  TempSrc:   PainSc: 0-No pain                 Cleda Mccreedy Demaree Liberto

## 2020-05-21 NOTE — Progress Notes (Signed)
*  PRELIMINARY RESULTS* Echocardiogram Echocardiogram Transesophageal has been performed.  Samuel [name] 05/21/2020, 11:09 AM

## 2020-05-21 NOTE — Progress Notes (Signed)
Progress Note  Patient Name: Samuel [name] Date of Encounter: 05/21/2020  [facility] Cardiologist: Debbe Odea, MD   Subjective   Uop -3.3L overnight. Denies SOB or chest pain. Still in rate controlled afib. Cath site, right radial, without complications. Plan for TEE/DCCV today.   Inpatient Medications    Scheduled Meds: . digoxin  0.125 mg Oral Daily  . furosemide  80 mg Intravenous BID  . metoprolol tartrate  100 mg Oral BID  . pantoprazole  40 mg Oral Daily  . sacubitril-valsartan  1 tablet Oral BID  . sodium chloride flush  3 mL Intravenous Q12H  . sodium chloride flush  3 mL Intravenous Q12H  . spironolactone  12.5 mg Oral Daily  . sucralfate  1 g Oral TID WC & HS   Continuous Infusions: . sodium chloride    . heparin 2,800 Units/hr (05/21/20 0622)   PRN Meds: sodium chloride, acetaminophen, morphine injection, ondansetron (ZOFRAN) IV, sodium chloride flush, traMADol   Vital Signs    Vitals:   05/20/20 1554 05/20/20 2003 05/21/20 0423 05/21/20 0739  BP: 106/71 117/81 110/67 115/75  Pulse: 98 85 94 73  Resp: 20 (!) 22 (!) 22 18  Temp: 98.4 F (36.9 C) 97.9 F (36.6 C) 98.5 F (36.9 C) 97.6 F (36.4 C)  TempSrc: Oral Oral Oral Oral  SpO2: 98% 97% 93% 96%  Weight:      Height:        Intake/Output Summary (Last 24 hours) at 05/21/2020 0815 Last data filed at 05/21/2020 0300 Gross per 24 hour  Intake 1638.68 ml  Output 3350 ml  Net -1711.32 ml   Last 3 Weights 05/20/2020 05/19/2020 05/19/2020  Weight (lbs) 291 lb 3.2 oz 292 lb 15.9 oz 293 lb 8 oz  Weight (kg) 132.087 kg 132.9 kg 133.131 kg      Telemetry    Afib HR 80-90s, PVCs - Personally Reviewed  ECG    No new - Personally Reviewed  Physical Exam   GEN: No acute distress.   Neck: No JVD Cardiac: RRR, no murmurs, rubs, or gallops.  Respiratory: Clear to auscultation bilaterally. GI: Soft, nontender, non-distended  MS: No edema; No deformity. Neuro:  Nonfocal  Psych:  Normal affect   Labs    High Sensitivity Troponin:   Recent Labs  Lab 05/16/20 1826  TROPONINIHS 16      Chemistry Recent Labs  Lab 05/16/20 1522 05/18/20 0514 05/19/20 1105  NA 139 139 139  K 3.8 3.8 4.4  CL 106 105 102  CO2 22 24 27   GLUCOSE 114* 130* 123*  BUN 17 18 21*  CREATININE 1.11 1.07 1.20  CALCIUM 9.0 9.0 9.6  PROT 8.0 7.4 8.3*  ALBUMIN 4.6 4.1 4.5  AST 60* 46* 44*  ALT 119* 104* 101*  ALKPHOS 57 47 51  BILITOT 1.7* 1.1 1.2  GFRNONAA >60 >60 >60  ANIONGAP 11 10 10      Hematology Recent Labs  Lab 05/19/20 0208 05/20/20 0413 05/21/20 0440  WBC 12.5* 11.4* 11.0*  RBC 4.71 4.99 4.97  HGB 15.6 16.4 16.3  HCT 44.7 48.1 50.9  MCV 94.9 96.4 102.4*  MCH 33.1 32.9 32.8  MCHC 34.9 34.1 32.0  RDW 12.8 12.8 12.7  PLT 373 381 348    BNP Recent Labs  Lab 05/19/20 1105  BNP 203.6*     DDimer No results for input(s): DDIMER in the last 168 hours.   Radiology    CARDIAC CATHETERIZATION  Result Date:  05/20/2020 Conclusions: 1. Mild, nonobstructive coronary artery disease including 20 to 30% ostial LCx and 20% mid/distal LCx stenoses.  Catheter induced vasospasm of the ostial/proximal RCA also noted, which resolved with intracoronary nitroglycerin. 2. Mildly elevated left heart and pulmonary artery pressures. 3. Moderately to severely elevated right heart filling pressures. 4. Moderately reduced cardiac output/index. Recommendations: 1. Continue IV diuresis. 2. Restart IV heparin 2 hours after TR band removal.  Transition to NOAC as soon as tomorrow if no evidence of bleeding or vascular injury. 3. Escalate goal-directed medical therapy of nonischemic cardiomyopathy, as tolerated. 4. Medical therapy and risk factor modification prevent progression of mild coronary artery disease. 5. Anticipate TEE/cardioversion tomorrow. Yvonne Kendall, MD [facility]    Cardiac Studies   R/L Cardiac cath 05/20/20 Conclusions: 1. Mild, nonobstructive coronary artery  disease including 20 to 30% ostial LCx and 20% mid/distal LCx stenoses.  Catheter induced vasospasm of the ostial/proximal RCA also noted, which resolved with intracoronary nitroglycerin. 2. Mildly elevated left heart and pulmonary artery pressures. 3. Moderately to severely elevated right heart filling pressures. 4. Moderately reduced cardiac output/index.  Recommendations: 1. Continue IV diuresis. 2. Restart IV heparin 2 hours after TR band removal.  Transition to NOAC as soon as tomorrow if no evidence of bleeding or vascular injury. 3. Escalate goal-directed medical therapy of nonischemic cardiomyopathy, as tolerated. 4. Medical therapy and risk factor modification prevent progression of mild coronary artery disease. 5. Anticipate TEE/cardioversion tomorrow.  Yvonne Kendall, MD [facility]  Echo limited 05/18/20 1. Left ventricular ejection fraction, by estimation, is 30 to 35%. The  left ventricle has moderately decreased function. The left ventricle  demonstrates global hypokinesis. The left ventricular internal cavity size  was moderately dilated.  2. Right ventricular systolic function is normal. The right ventricular  size is normal.  3. The mitral valve is normal in structure. No evidence of mitral valve  regurgitation. No evidence of mitral stenosis.  4. The aortic valve is normal in structure. Aortic valve regurgitation is  not visualized. No aortic stenosis is present.  5. The inferior vena cava is normal in size with greater than 50%  respiratory variability, suggesting right atrial pressure of 3 mmHg.     Patient Profile     45 y.o. male with pmh of morbid obesity, tobacco/marijuana/ETOH use, admitted with acute HFrEF and afib with RVR.   Assessment & Plan    Acute HFrEF NICM - EF 25-30% by echo this admission - cath showed mild, nonobstructive CAD consistent with NICM, possibly tachy-induced vs alcoholic. Filling pressures moderately elevated.  -  continue metoprolol 100mg  daily, transition to succinate post-cardioversion - continue Entresto - continue digoxin - spironolactone added - IV lasix 60mg  TID> IV lasix 80mg  BID - AM BMET pending - UOP -3.3L, weight down 1 lb - continue diuresis for today  Persistent Afib with RVR - IV heparin - he is still in afib, rates better controlled - plan for TEE/DCCV today - continue metoprolol and digoxin  Morbid obesity - encourage weight loss - might need sleep study for ODA  HTN - pressures wnl  Polysubstance abuse - advised to quit alcohol and tobacco use   For questions or updates, please contact [facility] Please consult [email or web address] for contact info under        Signed, Batya Citron , PA-C  05/21/2020, 8:15 AM

## 2020-05-21 NOTE — Progress Notes (Signed)
[facility]                                                                              Patient Demographics  Samuel [name], is a 45 y.o. male, DOB - 1975/12/15, ION:[id number]  Admit date - 05/16/2020   Admitting Physician Andris Baumann, MD  Outpatient Primary MD for the patient is Pcp, No  Outpatient specialists:   LOS - 5  days   Medical records reviewed and are as summarized below:    Chief Complaint  Patient presents with  . Abdominal Pain  . Shortness of Breath       Brief summary   Patient is a 45 year old male with history of morbid obesity, alcohol use, tobacco use, was referred from GI office because of rapid heart rate and irregular rhythm.  Patient was complaining of epigastric pain, abdominal distention, palpitations and shortness of breath.  He was found to have hypertensive urgency, acute systolic CHF and atrial fibrillation with RVR, heart rate in 160s.  Patient was treated with IV Cardizem infusion, IV heparin drip and IV Lasix. 2D echo showed EF 25 to 30%. Cardiology was consulted, underwent left heart cath which showed mild nonobstructive CAD. Outpatient follow-up with GI for fatty liver and cholelithiasis. Undergoing cardioversion today 5/4   Assessment & Plan    Principal Problem:   Atrial fibrillation with rapid ventricular response (HCC), persistent, new onset -Still in atrial fibrillation, rate better controlled, continue metoprolol, digoxin -Cardiology following, plan for TEE cardioversion today -Continue digoxin, metoprolol -Currently on IV heparin, will need NOAC, will await cardiology recommendations  Active Problems: Acute systolic CHF exacerbation, nonischemic cardiomyopathy -Likely precipitated due to acute A. fib with RVR -2D echo showed EF 25 to 30% -Patient underwent cardiac cath showed mild nonobstructive CAD consistent with an ICM, possibly tach induced versus alcoholic. -Continue digoxin, Entresto,  metoprolol -Currently on IV Lasix 80mg   twice daily -Continue strict I's and O's and daily weights, negative balance of 6.2 L -Weight down from 304lbs on admission to 291 today  Essential hypertension -BP currently stable, continue metoprolol, Lasix, Entresto    Alcohol use disorder, moderate, dependence (HCC), tobacco use -Currently stable, recommended alcohol cessation  History of cholelithiasis, hepatic steatosis, transaminitis -Continue outpatient follow-up with GI, LFTs improving    Morbid obesity (HCC) Estimated body mass index is 40.61 kg/m as calculated from the following:   Height as of this encounter: 5\' 11"  (1.803 m).   Weight as of this encounter: 132.1 kg.  Code Status: Full code DVT Prophylaxis:  IV heparin   Level of Care: Level of care: Progressive Cardiac Family Communication: Discussed all imaging results, lab results, explained to the patient and father at the bedside    Disposition Plan:     Status is: Inpatient  Remains inpatient appropriate because:Inpatient level of care appropriate due to severity of illness   Dispo: The patient is from: Home              Anticipated d/c is to: Home              Patient currently is not medically stable to d/c.  TEE -  DCCV today, still on IV Lasix for diuresis, hopefully DC next 24 to 48 hours if cleared by cardiology   Difficult to place patient No      Time Spent in minutes  35 minutes  Procedures:  2D echo Cardiac cath FINDINGS: 1. Mild, non-obstructive CAD with 20-30% ostial LAD stenosis and vasospasm of the ostial/proximal RCA. 2. Mildly elevated left heart filling pressure. 3. Moderately to severely elevated right heart filling pressure. 4. Moderately reduced cardiac output/index.  Consultants:   Cardiology  Antimicrobials:   Anti-infectives (From admission, onward)   None          Medications  Scheduled Meds: . [MAR Hold] digoxin  0.125 mg Oral Daily  . [MAR Hold] furosemide  80 mg  Intravenous BID  . [MAR Hold] metoprolol tartrate  100 mg Oral BID  . [MAR Hold] pantoprazole  40 mg Oral Daily  . [MAR Hold] sacubitril-valsartan  1 tablet Oral BID  . [MAR Hold] sodium chloride flush  3 mL Intravenous Q12H  . [MAR Hold] sodium chloride flush  3 mL Intravenous Q12H  . [MAR Hold] spironolactone  12.5 mg Oral Daily  . [MAR Hold] sucralfate  1 g Oral TID WC & HS   Continuous Infusions: . [MAR Hold] sodium chloride    . sodium chloride    . heparin 2,800 Units/hr (05/21/20 0622)   PRN Meds:.[MAR Hold] sodium chloride, [MAR Hold] acetaminophen, [MAR Hold]  morphine injection, [MAR Hold] ondansetron (ZOFRAN) IV, [MAR Hold] sodium chloride flush, [MAR Hold] traMADol      Subjective:   [name] was seen and examined today.  Sitting up, awaiting procedure at the time of Counter.  Feels lot better from the time of admission.  States no chest pain, shortness of breath.  Not on O2.  Stable and rate controlled A. fib, on IV Lasix. Patient denies dizziness, abdominal pain, N/V/D/C, new weakness, numbess, tingling. No acute events overnight.    Objective:   Vitals:   05/20/20 2003 05/21/20 0423 05/21/20 0739 05/21/20 1003  BP: 117/81 110/67 115/75 (!) 144/92  Pulse: 85 94 73   Resp: (!) 22 (!) 22 18 17   Temp: 97.9 F (36.6 C) 98.5 F (36.9 C) 97.6 F (36.4 C) 98.2 F (36.8 C)  TempSrc: Oral Oral Oral Oral  SpO2: 97% 93% 96% (!) 30%  Weight:      Height:        Intake/Output Summary (Last 24 hours) at 05/21/2020 1015 Last data filed at 05/21/2020 0300 Gross per 24 hour  Intake 1638.68 ml  Output 3150 ml  Net -1511.32 ml     Wt Readings from Last 3 Encounters:  05/20/20 132.1 kg     Exam  General: Alert and oriented x 3, NAD  Cardiovascular: S1 S2 auscultated, irregularly irregular   respiratory: Clear to auscultation bilaterally, no wheezing, rales or rhonchi  Gastrointestinal: Soft, nontender, nondistended, + bowel sounds  Ext: trace pedal edema  bilaterally  Neuro: no neurodeficits  Musculoskeletal: No digital cyanosis, clubbing  Skin: No rashes  Psych: Normal affect and demeanor, alert and oriented x3    Data Reviewed:  I have personally reviewed following labs and imaging studies  Micro Results Recent Results (from the past 240 hour(s))  Resp Panel by RT-PCR (Flu A&B, Covid) Nasopharyngeal Swab     Status: None   Collection Time: 05/16/20  6:26 PM   Specimen: Nasopharyngeal Swab; Nasopharyngeal(NP) swabs in vial transport medium  Result Value Ref Range Status   SARS Coronavirus  2 by RT PCR NEGATIVE NEGATIVE Final    Comment: (NOTE) SARS-CoV-2 target nucleic acids are NOT DETECTED.  The SARS-CoV-2 RNA is generally detectable in upper respiratory specimens during the acute phase of infection. The lowest concentration of SARS-CoV-2 viral copies this assay can detect is 138 copies/mL. A negative result does not preclude SARS-Cov-2 infection and should not be used as the sole basis for treatment or other patient management decisions. A negative result may occur with  improper specimen collection/handling, submission of specimen other than nasopharyngeal swab, presence of viral mutation(s) within the areas targeted by this assay, and inadequate number of viral copies(<138 copies/mL). A negative result must be combined with clinical observations, patient history, and epidemiological information. The expected result is Negative.  Fact Sheet for Patients:  BloggerCourse.com  Fact Sheet for Healthcare Providers:  SeriousBroker.it  This test is no t yet approved or cleared by the [place] FDA and  has been authorized for detection and/or diagnosis of SARS-CoV-2 by FDA under an Emergency Use Authorization (EUA). This EUA will remain  in effect (meaning this test can be used) for the duration of the COVID-19 declaration under Section 564(b)(1) of the Act,  21 U.S.C.section 360bbb-3(b)(1), unless the authorization is terminated  or revoked sooner.       Influenza A by PCR NEGATIVE NEGATIVE Final   Influenza B by PCR NEGATIVE NEGATIVE Final    Comment: (NOTE) The Xpert Xpress SARS-CoV-2/FLU/RSV plus assay is intended as an aid in the diagnosis of influenza from Nasopharyngeal swab specimens and should not be used as a sole basis for treatment. Nasal washings and aspirates are unacceptable for Xpert Xpress SARS-CoV-2/FLU/RSV testing.  Fact Sheet for Patients: BloggerCourse.com  Fact Sheet for Healthcare Providers: SeriousBroker.it  This test is not yet approved or cleared by the [place] FDA and has been authorized for detection and/or diagnosis of SARS-CoV-2 by FDA under an Emergency Use Authorization (EUA). This EUA will remain in effect (meaning this test can be used) for the duration of the COVID-19 declaration under Section 564(b)(1) of the Act, 21 U.S.C. section 360bbb-3(b)(1), unless the authorization is terminated or revoked.  Performed at [facility], [address], [place], [place] [address]     Radiology Reports CT ABDOMEN PELVIS W CONTRAST  Result Date: 05/16/2020 CLINICAL DATA:  Nonlocalized acute abdominal pain. EXAM: CT ABDOMEN AND PELVIS WITH CONTRAST TECHNIQUE: Multidetector CT imaging of the abdomen and pelvis was performed using the standard protocol following bolus administration of intravenous contrast. CONTRAST:  OMNIPAQUE IOHEXOL 300 MG/ML  SOLN COMPARISON:  None. FINDINGS: Lower chest: Trace right pleural effusion. Hepatobiliary: The hepatic parenchyma is diffusely mildly hypodense compared to the splenic parenchyma consistent with fatty infiltration. Calcified gallstone noted within the gallbladder lumen. No gallbladder wall thickening or pericholecystic fluid. No biliary dilatation. Pancreas: Diffusely atrophic. No focal lesion. Otherwise  normal pancreatic contour. No surrounding inflammatory changes. No main pancreatic ductal dilatation. Spleen: Normal in size without focal abnormality. Adrenals/Urinary Tract: No adrenal nodule bilaterally. Bilateral kidneys enhance symmetrically. No hydronephrosis. No hydroureter. The urinary bladder is unremarkable. On delayed imaging, there is no urothelial wall thickening and there are no filling defects in the opacified portions of the bilateral collecting systems or ureters. Stomach/Bowel: PO contrast reaches the transverse colon. Stomach is within normal limits. No evidence of bowel wall thickening or dilatation. Appendix appears normal. Vascular/Lymphatic: No abdominal aorta or iliac aneurysm. Mild atherosclerotic plaque of the aorta and its branches. No abdominal, pelvic, or inguinal lymphadenopathy. Reproductive:  Prostate is unremarkable. Other: No intraperitoneal free fluid. No intraperitoneal free gas. No organized fluid collection. Musculoskeletal: No acute or significant osseous findings. IMPRESSION: 1. Trace right pleural effusion 2. Cholelithiasis with no CT findings of acute cholecystitis tightest choledocholithiasis. 3. Mild hepatic steatosis. 4.  Aortic Atherosclerosis (ICD10-I70.0). Electronically Signed   By: Tish Frederickson M.D.   On: 05/16/2020 19:50   CARDIAC CATHETERIZATION  Result Date: 05/20/2020 Conclusions: 1. Mild, nonobstructive coronary artery disease including 20 to 30% ostial LCx and 20% mid/distal LCx stenoses.  Catheter induced vasospasm of the ostial/proximal RCA also noted, which resolved with intracoronary nitroglycerin. 2. Mildly elevated left heart and pulmonary artery pressures. 3. Moderately to severely elevated right heart filling pressures. 4. Moderately reduced cardiac output/index. Recommendations: 1. Continue IV diuresis. 2. Restart IV heparin 2 hours after TR band removal.  Transition to NOAC as soon as tomorrow if no evidence of bleeding or vascular injury. 3.  Escalate goal-directed medical therapy of nonischemic cardiomyopathy, as tolerated. 4. Medical therapy and risk factor modification prevent progression of mild coronary artery disease. 5. Anticipate TEE/cardioversion tomorrow. Yvonne Kendall, MD [facility]   DG Chest Portable 1 View  Result Date: 05/16/2020 CLINICAL DATA:  Shortness of breath. EXAM: PORTABLE CHEST 1 VIEW COMPARISON:  None. FINDINGS: The heart size appears to be generous. There are prominent interstitial lung markings bilaterally with blunting of the costophrenic angles bilaterally. There are streaky bibasilar airspace opacities suggestive of atelectasis. There is no pneumothorax. There is an old healed right clavicle fracture. IMPRESSION: Cardiomegaly with findings consistent with volume overload and possible developing interstitial edema. An atypical infectious process seems less likely but is not entirely excluded. Electronically Signed   By: Katherine Mantle M.D.   On: 05/16/2020 18:28   ECHOCARDIOGRAM COMPLETE  Result Date: 05/17/2020    ECHOCARDIOGRAM REPORT   Patient Name:   Samuel [name] Date of Exam: 05/17/2020 Medical Rec #:  [id number]                 Height:       71.0 in Accession #:    [id number]                Weight:       304.0 lb Date of Birth:  [birth year]                 BSA:          2.519 m Patient Age:    44 years                  BP:           144/115 mmHg Patient Gender: M                         HR:           71 bpm. Exam Location:  [facility] Procedure: 2D Echo, Cardiac Doppler and Color Doppler Indications:     Atrial Fibrillation I48.91  History:         Patient has no prior history of Echocardiogram examinations.  Sonographer:     Neysa Bonito Roar Referring Phys:  [id number] Andris Baumann Diagnosing Phys: Marcina Millard MD IMPRESSIONS  1. Left ventricular ejection fraction, by estimation, is 25 to 30%. The left ventricle has severely decreased function. The left ventricle has no regional wall motion  abnormalities. The left ventricular internal cavity size was mildly to moderately dilated. Left ventricular diastolic parameters are  indeterminate.  2. Right ventricular systolic function is normal. The right ventricular size is normal.  3. The mitral valve is normal in structure. Mild to moderate mitral valve regurgitation. No evidence of mitral stenosis.  4. The aortic valve is normal in structure. Aortic valve regurgitation is not visualized. No aortic stenosis is present.  5. The inferior vena cava is normal in size with greater than 50% respiratory variability, suggesting right atrial pressure of 3 mmHg. FINDINGS  Left Ventricle: Left ventricular ejection fraction, by estimation, is 25 to 30%. The left ventricle has severely decreased function. The left ventricle has no regional wall motion abnormalities. The left ventricular internal cavity size was mildly to moderately dilated. There is no left ventricular hypertrophy. Left ventricular diastolic parameters are indeterminate. Right Ventricle: The right ventricular size is normal. No increase in right ventricular wall thickness. Right ventricular systolic function is normal. Left Atrium: Left atrial size was normal in size. Right Atrium: Right atrial size was normal in size. Pericardium: There is no evidence of pericardial effusion. Mitral Valve: The mitral valve is normal in structure. Mild to moderate mitral valve regurgitation. No evidence of mitral valve stenosis. Tricuspid Valve: The tricuspid valve is normal in structure. Tricuspid valve regurgitation is mild . No evidence of tricuspid stenosis. Aortic Valve: The aortic valve is normal in structure. Aortic valve regurgitation is not visualized. No aortic stenosis is present. Aortic valve peak gradient measures 5.7 mmHg. Pulmonic Valve: The pulmonic valve was normal in structure. Pulmonic valve regurgitation is not visualized. No evidence of pulmonic stenosis. Aorta: The aortic root is normal in size and  structure. Venous: The inferior vena cava is normal in size with greater than 50% respiratory variability, suggesting right atrial pressure of 3 mmHg. IAS/Shunts: No atrial level shunt detected by color flow Doppler.  LEFT VENTRICLE PLAX 2D LVIDd:         6.03 cm  Diastology LVIDs:         5.45 cm  LV e' medial:  7.40 cm/s LV PW:         1.14 cm  LV e' lateral: 8.92 cm/s LV IVS:        0.99 cm LVOT diam:     2.00 cm LVOT Area:     3.14 cm  RIGHT VENTRICLE RV Mid diam:    2.68 cm RV S prime:     12.40 cm/s TAPSE (M-mode): 1.6 cm LEFT ATRIUM              Index       RIGHT ATRIUM           Index LA diam:        5.00 cm  1.98 cm/m  RA Area:     13.50 cm LA Vol (A2C):   85.3 ml  33.86 ml/m RA Volume:   27.40 ml  10.88 ml/m LA Vol (A4C):   115.0 ml 45.65 ml/m LA Biplane Vol: 102.0 ml 40.49 ml/m  AORTIC VALVE                PULMONIC VALVE AV Area (Vmax): 2.50 cm    PV Vmax:       0.95 m/s AV Vmax:        119.00 cm/s PV Peak grad:  3.6 mmHg AV Peak Grad:   5.7 mmHg LVOT Vmax:      94.70 cm/s  AORTA Ao Root diam: 2.70 cm TRICUSPID VALVE TR Peak grad:   20.2 mmHg TR Vmax:  225.00 cm/s  SHUNTS Systemic Diam: 2.00 cm Marcina Millard MD Electronically signed by Marcina Millard MD Signature Date/Time: 05/17/2020/2:27:44 PM    Final    ECHOCARDIOGRAM LIMITED  Result Date: 05/18/2020    ECHOCARDIOGRAM LIMITED REPORT   Patient Name:   Samuel [name] Date of Exam: 05/18/2020 Medical Rec #:  [id number]                 Height:       71.0 in Accession #:    [id number]                Weight:       302.2 lb Date of Birth:  Feb 27, 1975                 BSA:          2.513 m Patient Age:    44 years                  BP:           129/94 mmHg Patient Gender: M                         HR:           98 bpm. Exam Location:  [facility] Procedure: Limited Echo and Intracardiac Opacification Agent Indications:     Atrial Fibrillation  History:         Patient has prior history of Echocardiogram examinations. Risk                   Factors:Morbid Obesity.  Sonographer:     L Thornton-Maynard Referring Phys:  [id number] Dois Davenport BERGE Diagnosing Phys: Marcina Millard MD  Sonographer Comments: Image acquisition challenging due to patient body habitus. IMPRESSIONS  1. Left ventricular ejection fraction, by estimation, is 30 to 35%. The left ventricle has moderately decreased function. The left ventricle demonstrates global hypokinesis. The left ventricular internal cavity size was moderately dilated.  2. Right ventricular systolic function is normal. The right ventricular size is normal.  3. The mitral valve is normal in structure. No evidence of mitral valve regurgitation. No evidence of mitral stenosis.  4. The aortic valve is normal in structure. Aortic valve regurgitation is not visualized. No aortic stenosis is present.  5. The inferior vena cava is normal in size with greater than 50% respiratory variability, suggesting right atrial pressure of 3 mmHg. FINDINGS  Left Ventricle: Left ventricular ejection fraction, by estimation, is 30 to 35%. The left ventricle has moderately decreased function. The left ventricle demonstrates global hypokinesis. Definity contrast agent was given IV to delineate the left ventricular endocardial borders. The left ventricular internal cavity size was moderately dilated. There is no left ventricular hypertrophy. Right Ventricle: The right ventricular size is normal. No increase in right ventricular wall thickness. Right ventricular systolic function is normal. Left Atrium: Left atrial size was normal in size. Right Atrium: Right atrial size was normal in size. Pericardium: There is no evidence of pericardial effusion. Mitral Valve: The mitral valve is normal in structure. No evidence of mitral valve stenosis. Tricuspid Valve: The tricuspid valve is normal in structure. Tricuspid valve regurgitation is not demonstrated. No evidence of tricuspid stenosis. Aortic Valve: The aortic valve is normal in  structure. Aortic valve regurgitation is not visualized. No aortic stenosis is present. Pulmonic Valve: The pulmonic valve was normal in structure. Pulmonic valve regurgitation is not visualized. No evidence of pulmonic  stenosis. Aorta: The aortic root is normal in size and structure. Venous: The inferior vena cava is normal in size with greater than 50% respiratory variability, suggesting right atrial pressure of 3 mmHg. IAS/Shunts: No atrial level shunt detected by color flow Doppler. LEFT VENTRICLE PLAX 2D LVIDd:         6.42 cm LVIDs:         5.45 cm LV PW:         0.94 cm LV IVS:        1.00 cm  LV Volumes (MOD) LV vol d, MOD A2C: 152.0 ml LV vol d, MOD A4C: 209.0 ml LV vol s, MOD A2C: 104.0 ml LV vol s, MOD A4C: 138.0 ml LV SV MOD A2C:     48.0 ml LV SV MOD A4C:     209.0 ml LV SV MOD BP:      57.2 ml Marcina Millard MD Electronically signed by Marcina Millard MD Signature Date/Time: 05/18/2020/1:38:53 PM    Final     Lab Data:  CBC: Recent Labs  Lab 05/16/20 1522 05/18/20 0514 05/19/20 0208 05/20/20 0413 05/21/20 0440  WBC 12.6* 11.5* 12.5* 11.4* 11.0*  HGB 14.6 13.9 15.6 16.4 16.3  HCT 43.1 41.6 44.7 48.1 50.9  MCV 95.8 97.2 94.9 96.4 102.4*  PLT 351 350 373 381 348   Basic Metabolic Panel: Recent Labs  Lab 05/16/20 1522 05/17/20 1845 05/18/20 0514 05/19/20 1105 05/21/20 0855  NA 139  --  139 139 139  K 3.8  --  3.8 4.4 4.1  CL 106  --  105 102 108  CO2 22  --  24 27 21*  GLUCOSE 114*  --  130* 123* 113*  BUN 17  --  18 21* 25*  CREATININE 1.11  --  1.07 1.20 0.98  CALCIUM 9.0  --  9.0 9.6 9.4  MG  --  2.3 2.2  --   --    GFR: Estimated Creatinine Clearance: 131.9 mL/min (by C-G formula based on SCr of 0.98 mg/dL). Liver Function Tests: Recent Labs  Lab 05/16/20 1522 05/18/20 0514 05/19/20 1105  AST 60* 46* 44*  ALT 119* 104* 101*  ALKPHOS 57 47 51  BILITOT 1.7* 1.1 1.2  PROT 8.0 7.4 8.3*  ALBUMIN 4.6 4.1 4.5   Recent Labs  Lab 05/16/20 1522   LIPASE 28   No results for input(s): AMMONIA in the last 168 hours. Coagulation Profile: Recent Labs  Lab 05/16/20 1826  INR 1.1   Cardiac Enzymes: No results for input(s): CKTOTAL, CKMB, CKMBINDEX, TROPONINI in the last 168 hours. BNP (last 3 results) No results for input(s): PROBNP in the last 8760 hours. HbA1C: No results for input(s): HGBA1C in the last 72 hours. CBG: No results for input(s): GLUCAP in the last 168 hours. Lipid Profile: No results for input(s): CHOL, HDL, LDLCALC, TRIG, CHOLHDL, LDLDIRECT in the last 72 hours. Thyroid Function Tests: No results for input(s): TSH, T4TOTAL, FREET4, T3FREE, THYROIDAB in the last 72 hours. Anemia Panel: No results for input(s): VITAMINB12, FOLATE, FERRITIN, TIBC, IRON, RETICCTPCT in the last 72 hours. Urine analysis:    Component Value Date/Time   COLORURINE YELLOW (A) 05/16/2020 2037   APPEARANCEUR CLEAR (A) 05/16/2020 2037   LABSPEC 1.028 05/16/2020 2037   PHURINE 5.0 05/16/2020 2037   GLUCOSEU NEGATIVE 05/16/2020 2037   HGBUR SMALL (A) 05/16/2020 2037   BILIRUBINUR NEGATIVE 05/16/2020 2037   KETONESUR 5 (A) 05/16/2020 2037   PROTEINUR NEGATIVE 05/16/2020 2037  NITRITE NEGATIVE 05/16/2020 2037   LEUKOCYTESUR NEGATIVE 05/16/2020 2037     Dontavia Brand M.D. [facility] 05/21/2020, 10:15 AM  Available via Epic secure chat 7am-7pm After 7 pm, please refer to night coverage provider listed on amion.
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
## 2020-05-21 NOTE — TOC Initial Note (Signed)
Transition of Care [facility]) - Initial/Assessment Note    Patient Details  Name: Samuel [name] MRN: [id number] Date of Birth: [birth year]  [facility]) CM/SW Contact:    Alberteen Sam, LCSW Phone Number: 05/21/2020, 3:55 PM  Clinical Narrative:                  CSW consulted for heart failure home health screen and need for scale. CSW met with patient who reports he lives home alone with his dog who has a pacemaker. Patient reports he believes he can care for himself and has no home needs right now, however will let nurse and CSW know if things should change tomorrow. Patient does endorse needing a scale, CSW to drop off prior to discharge.   No further discharge needs identified at this time.   Expected Discharge Plan: Home/Self Care Barriers to Discharge: Continued Medical Work up   Patient Goals and CMS Choice Patient states their goals for this hospitalization and ongoing recovery are:: to go home CMS Medicare.gov Compare Post Acute Care list provided to:: Patient Choice offered to / list presented to : Patient  Expected Discharge Plan and Services Expected Discharge Plan: Home/Self Care       Living arrangements for the past 2 months: Single Family Home                                      Prior Living Arrangements/Services Living arrangements for the past 2 months: Single Family Home Lives with:: Self   Do you feel safe going back to the place where you live?: Yes               Activities of Daily Living Home Assistive Devices/Equipment: None ADL Screening (condition at time of admission) Patient's cognitive ability adequate to safely complete daily activities?: Yes Is the patient deaf or have difficulty hearing?: No Does the patient have difficulty seeing, even when wearing glasses/contacts?: No Does the patient have difficulty concentrating, remembering, or making decisions?: No Patient able to express need for assistance with  ADLs?: Yes Does the patient have difficulty dressing or bathing?: No Independently performs ADLs?: Yes (appropriate for developmental age) Does the patient have difficulty walking or climbing stairs?: No Weakness of Legs: None Weakness of Arms/Hands: None  Permission Sought/Granted                  Emotional Assessment Appearance:: Appears stated age Attitude/Demeanor/Rapport: Gracious Affect (typically observed): Calm Orientation: : Oriented to Self,Oriented to Place,Oriented to  Time,Oriented to Situation Alcohol / Substance Use: Not Applicable Psych Involvement: No (comment)  Admission diagnosis:  New onset atrial fibrillation ([place]) [I48.91] Rapid atrial fibrillation ([place]) [I48.91] Atrial fibrillation with rapid ventricular response ([place]) [I48.91] Patient Active Problem List   Diagnosis Date Noted  . Persistent atrial fibrillation ([place])   . Cardiomyopathy ([place])   . Acute HFrEF (heart failure with reduced ejection fraction) ([place])   . Atrial fibrillation with rapid ventricular response ([place]) 05/16/2020  . Alcohol use disorder, moderate, dependence ([place]) 05/16/2020  . Morbid obesity ([place]) 05/16/2020  . Cholelithiasis without obstruction 05/16/2020  . Epigastric pain 05/16/2020  . NSAID long-term use 05/16/2020   PCP:  Pcp, No Pharmacy:   [id number] - [place], [address] [place] [address] Phone: [phone] Fax: [phone]     Social Determinants of Health (SDOH) Interventions  Readmission Risk Interventions No flowsheet data found.

## 2020-05-22 ENCOUNTER — Other Ambulatory Visit: Payer: Self-pay

## 2020-05-22 DIAGNOSIS — I420 Dilated cardiomyopathy: Secondary | ICD-10-CM

## 2020-05-22 DIAGNOSIS — F1020 Alcohol dependence, uncomplicated: Secondary | ICD-10-CM | POA: Diagnosis not present

## 2020-05-22 DIAGNOSIS — I5021 Acute systolic (congestive) heart failure: Secondary | ICD-10-CM | POA: Diagnosis not present

## 2020-05-22 DIAGNOSIS — I4891 Unspecified atrial fibrillation: Secondary | ICD-10-CM | POA: Diagnosis not present

## 2020-05-22 LAB — CBC
HCT: 47 % (ref 39.0–52.0)
Hemoglobin: 15.8 g/dL (ref 13.0–17.0)
MCH: 32.8 pg (ref 26.0–34.0)
MCHC: 33.6 g/dL (ref 30.0–36.0)
MCV: 97.5 fL (ref 80.0–100.0)
Platelets: 359 10*3/uL (ref 150–400)
RBC: 4.82 MIL/uL (ref 4.22–5.81)
RDW: 12.8 % (ref 11.5–15.5)
WBC: 10.6 10*3/uL — ABNORMAL HIGH (ref 4.0–10.5)
nRBC: 0 % (ref 0.0–0.2)

## 2020-05-22 LAB — LIPID PANEL
Cholesterol: 216 mg/dL — ABNORMAL HIGH (ref 0–200)
HDL: 33 mg/dL — ABNORMAL LOW (ref >40)
LDL Cholesterol: 141 mg/dL — ABNORMAL HIGH (ref 0–99)
Total CHOL/HDL Ratio: 6.5 RATIO
Triglycerides: 208 mg/dL — ABNORMAL HIGH (ref <150)
VLDL: 42 mg/dL — ABNORMAL HIGH (ref 0–40)

## 2020-05-22 LAB — BASIC METABOLIC PANEL
Anion gap: 11 (ref 5–15)
BUN: 23 mg/dL — ABNORMAL HIGH (ref 6–20)
CO2: 23 mmol/L (ref 22–32)
Calcium: 9.6 mg/dL (ref 8.9–10.3)
Chloride: 108 mmol/L (ref 98–111)
Creatinine, Ser: 1 mg/dL (ref 0.61–1.24)
GFR, Estimated: >60 mL/min (ref >60)
Glucose, Bld: 145 mg/dL — ABNORMAL HIGH (ref 70–99)
Potassium: 3.6 mmol/L (ref 3.5–5.1)
Sodium: 142 mmol/L (ref 135–145)

## 2020-05-22 LAB — HEMOGLOBIN A1C
Hgb A1c MFr Bld: 5.5 % (ref 4.8–5.6)
Mean Plasma Glucose: 111.15 mg/dL

## 2020-05-22 MED ORDER — DIGOXIN 125 MCG PO TABS: 0.1250 mg | ORAL_TABLET | Freq: Every day | ORAL | 3 refills | Status: DC

## 2020-05-22 MED ORDER — APIXABAN 5 MG PO TABS
5.0000 mg | ORAL_TABLET | Freq: Two times a day (BID) | ORAL | 0 refills | Status: DC
  Filled 2020-05-22: qty 60, 30d supply, fill #0

## 2020-05-22 MED ORDER — FUROSEMIDE 40 MG PO TABS: 40.0000 mg | ORAL_TABLET | Freq: Every day | ORAL | Status: DC

## 2020-05-22 MED ORDER — SPIRONOLACTONE 25 MG PO TABS: 12.5000 mg | ORAL_TABLET | Freq: Every day | ORAL | 3 refills | Status: DC

## 2020-05-22 MED ORDER — ENTRESTO 24-26 MG PO TABS
1.0000 | ORAL_TABLET | Freq: Two times a day (BID) | ORAL | 0 refills | Status: DC
  Filled 2020-05-22: qty 60, 30d supply, fill #0

## 2020-05-22 MED ORDER — EZETIMIBE 10 MG PO TABS: 10.0000 mg | ORAL_TABLET | Freq: Every day | ORAL | Status: DC

## 2020-05-22 MED ORDER — EZETIMIBE 10 MG PO TABS: 10.0000 mg | ORAL_TABLET | Freq: Every day | ORAL | 3 refills | Status: DC

## 2020-05-22 MED ORDER — PANTOPRAZOLE SODIUM 40 MG PO TBEC: 40.0000 mg | DELAYED_RELEASE_TABLET | Freq: Every day | ORAL | 3 refills | Status: DC

## 2020-05-22 MED ORDER — METOPROLOL SUCCINATE ER 100 MG PO TB24: 100.0000 mg | ORAL_TABLET | Freq: Every day | ORAL | 3 refills | Status: AC

## 2020-05-22 MED ORDER — FUROSEMIDE 40 MG PO TABS: 40.0000 mg | ORAL_TABLET | Freq: Every day | ORAL | 3 refills | Status: DC

## 2020-05-22 NOTE — TOC Transition Note (Signed)
Transition of Care [facility]) - CM/SW Discharge Note   Patient Details  Name: Samuel [name] MRN: [id number] Date of Birth: [birth year]  Transition [facility]) CM/SW Contact:  Liliana Cline, LCSW Phone Number: 05/22/2020, 1:05 PM   Clinical Narrative:   Patient to discharge home today. Scale provided to patient at bedside. No additional TOC needs identified.     Final next level of care: Home/Self Care Barriers to Discharge: Barriers Resolved   Patient Goals and CMS Choice Patient states their goals for this hospitalization and ongoing recovery are:: to return home with self care CMS Medicare.gov Compare Post Acute Care list provided to:: Patient Choice offered to / list presented to : Patient  Discharge Placement                  Name of family member notified: patient notified. Patient and family notified of of transfer: 05/22/20  Discharge Plan and Services                                     Social Determinants of Health (SDOH) Interventions     Readmission Risk Interventions No flowsheet data found.

## 2020-05-22 NOTE — Progress Notes (Signed)
Patient discharged to home. Tele and IV d/c'd.  Reviewed post care for radial cardiac cath as well as daily weights and restricting fluids and when to call MD. Patient verbalizes understanding. Patient given scale prior to discharge.

## 2020-05-22 NOTE — Progress Notes (Signed)
Progress Note  Patient Name: Samuel [name] Date of Encounter: 05/22/2020  Primary Cardiologist: Agbor-Etang  Subjective   Status post successful TEE-guided DCCV 5/4. Feels "1000% better." No chest pain, SOB, or palpitations. Has ambulated without issues. Documented UOP 2.1 L for the past 24 hours with a net - 8.3 L for the admission. No recent weights. Renal function stable.   Inpatient Medications    Scheduled Meds: . apixaban  5 mg Oral BID  . digoxin  0.125 mg Oral Daily  . furosemide  40 mg Intravenous BID  . metoprolol succinate  100 mg Oral Daily  . pantoprazole  40 mg Oral Daily  . sacubitril-valsartan  1 tablet Oral BID  . sodium chloride flush  3 mL Intravenous Q12H  . sodium chloride flush  3 mL Intravenous Q12H  . spironolactone  12.5 mg Oral Daily  . sucralfate  1 g Oral TID WC & HS   Continuous Infusions: . sodium chloride    . sodium chloride     PRN Meds: sodium chloride, acetaminophen, morphine injection, ondansetron (ZOFRAN) IV, sodium chloride flush, traMADol   Vital Signs    Vitals:   05/21/20 1531 05/21/20 1939 05/22/20 0359 05/22/20 0742  BP: 108/90 112/81 119/77 122/85  Pulse: 68 76 77 72  Resp: 18 18 18 16   Temp: 97.6 F (36.4 C) 97.9 F (36.6 C) 97.7 F (36.5 C) 98.4 F (36.9 C)  TempSrc: Oral Oral Oral   SpO2: 97% 95% 96% 96%  Weight:      Height:        Intake/Output Summary (Last 24 hours) at 05/22/2020 0834 Last data filed at 05/22/2020 0403 Gross per 24 hour  Intake 1020 ml  Output 3150 ml  Net -2130 ml   Filed Weights   05/19/20 1357 05/19/20 1922 05/20/20 0500  Weight: 133.1 kg 132.9 kg 132.1 kg    Telemetry    SR with rates in the 60s bpm, rare ventricular triplet - Personally Reviewed  ECG    Afib 97 bpm, nonspecific lateral st/t changes - Personally Reviewed  Physical Exam   GEN: No acute distress.   Neck: No JVD. Cardiac: RRR, no murmurs, rubs, or gallops.  Respiratory: Clear to auscultation  bilaterally.  GI: Soft, nontender, non-distended.   MS: No edema; No deformity. Neuro:  Alert and oriented x 3; Nonfocal.  Psych: Normal affect.  Labs    Chemistry Recent Labs  Lab 05/16/20 1522 05/18/20 0514 05/19/20 1105 05/21/20 0855 05/22/20 0421  NA 139 139 139 139 142  K 3.8 3.8 4.4 4.1 3.6  CL 106 105 102 108 108  CO2 22 24 27  21* 23  GLUCOSE 114* 130* 123* 113* 145*  BUN 17 18 21* 25* 23*  CREATININE 1.11 1.07 1.20 0.98 1.00  CALCIUM 9.0 9.0 9.6 9.4 9.6  PROT 8.0 7.4 8.3*  --   --   ALBUMIN 4.6 4.1 4.5  --   --   AST 60* 46* 44*  --   --   ALT 119* 104* 101*  --   --   ALKPHOS 57 47 51  --   --   BILITOT 1.7* 1.1 1.2  --   --   GFRNONAA >60 >60 >60 >60 >60  ANIONGAP 11 10 10 10 11      Hematology Recent Labs  Lab 05/20/20 0413 05/21/20 0440 05/22/20 0421  WBC 11.4* 11.0* 10.6*  RBC 4.99 4.97 4.82  HGB 16.4 16.3 15.8  HCT 48.1  50.9 47.0  MCV 96.4 102.4* 97.5  MCH 32.9 32.8 32.8  MCHC 34.1 32.0 33.6  RDW 12.8 12.7 12.8  PLT 381 348 359    Cardiac EnzymesNo results for input(s): TROPONINI in the last 168 hours. No results for input(s): TROPIPOC in the last 168 hours.   BNP Recent Labs  Lab 05/19/20 1105  BNP 203.6*     DDimer No results for input(s): DDIMER in the last 168 hours.   Radiology    CT abdomen/pelvis 05/16/2020: IMPRESSION: 1. Trace right pleural effusion 2. Cholelithiasis with no CT findings of acute cholecystitis tightest choledocholithiasis. 3. Mild hepatic steatosis. 4.  Aortic Atherosclerosis (ICD10-I70.0). __________  CXR 05/16/2020: IMPRESSION: Cardiomegaly with findings consistent with volume overload and possible developing interstitial edema. An atypical infectious process seems less likely but is not entirely excluded.   Cardiac Studies   2D echo 05/17/2020: 1. Left ventricular ejection fraction, by estimation, is 25 to 30%. The  left ventricle has severely decreased function. The left ventricle has no   regional wall motion abnormalities. The left ventricular internal cavity  size was mildly to moderately  dilated. Left ventricular diastolic parameters are indeterminate.  2. Right ventricular systolic function is normal. The right ventricular  size is normal.  3. The mitral valve is normal in structure. Mild to moderate mitral valve  regurgitation. No evidence of mitral stenosis.  4. The aortic valve is normal in structure. Aortic valve regurgitation is  not visualized. No aortic stenosis is present.  5. The inferior vena cava is normal in size with greater than 50%  respiratory variability, suggesting right atrial pressure of 3 mmHg. __________  Limited echo 05/18/2020: 1. Left ventricular ejection fraction, by estimation, is 30 to 35%. The  left ventricle has moderately decreased function. The left ventricle  demonstrates global hypokinesis. The left ventricular internal cavity size  was moderately dilated.  2. Right ventricular systolic function is normal. The right ventricular  size is normal.  3. The mitral valve is normal in structure. No evidence of mitral valve  regurgitation. No evidence of mitral stenosis.  4. The aortic valve is normal in structure. Aortic valve regurgitation is  not visualized. No aortic stenosis is present.  5. The inferior vena cava is normal in size with greater than 50%  respiratory variability, suggesting right atrial pressure of 3 mmHg. __________  [facility] 05/20/2020: Conclusions: 1. Mild, nonobstructive coronary artery disease including 20 to 30% ostial LCx and 20% mid/distal LCx stenoses.  Catheter induced vasospasm of the ostial/proximal RCA also noted, which resolved with intracoronary nitroglycerin. 2. Mildly elevated left heart and pulmonary artery pressures. 3. Moderately to severely elevated right heart filling pressures. 4. Moderately reduced cardiac output/index.  Recommendations: 1. Continue IV diuresis. 2. Restart IV heparin 2  hours after TR band removal.  Transition to NOAC as soon as tomorrow if no evidence of bleeding or vascular injury. 3. Escalate goal-directed medical therapy of nonischemic cardiomyopathy, as tolerated. 4. Medical therapy and risk factor modification prevent progression of mild coronary artery disease. 5. Anticipate TEE/cardioversion tomorrow. __________  TEE/DCCV 05/21/2020: In brief, imaging revealed severely reduced ejection fraction, LVEF 25%, no mural apical thrombus.  .    Imaging of the septum showed no ASD or VSD 2D and color flow confirmed no PFO  The LA was well visualized in orthogonal views.  There was no thrombus in the LA and LA appendage   Cardioversion procedure note For atrial fibrillation.  Findings: Post procedure EKG shows: NSR Complications: None  Patient did tolerate procedure well.  Patient Profile     45 y.o. male with history of morbid obesity, tobacco/marijuana/EtOH use, HTN, and hepatic steatosis/transaminitis who was admitted with acute HFrEF and Afib with RVR.  Assessment & Plan    1. Persistent Afib: -Status post TEE-guided DCCV 05/21/2020 -Maintaining sinus rhythm -Toprol XL -Eliquis -CHADS2VASc at least 3 (CHF, HTN, vascular disease)  2. Nonobstructive CAD: -Cath this admission showed mild nonobstructive CAD as outlined above -Eliquis in place of ASA given underlying Afib with recent DCCV -Risk factor modification  -Add on an A1c and lipid panel for risk stratification   3. HFrEF secondary to NICM: -Cardiology possibly tachy-mediated, plan for outpatient limited echo in several months time in sinus rhythm and on maximally tolerated GDMT to reassess LVSF -Transition IV Lasix to oral Lasix 40 mg daily with an extra dose in the PM for weight gain  -Continue Toprol XL, Entresto, spironolactone, and digoxin -Escalate GDMT as able moving forward with addition of SGLT2i in the outpatient setting  -Digoxin 0.7 on 5/2, pending this morning  -CHF  education   4. Obesity: -Weight loss advised -Needs outpatient sleep study if not already done    For questions or updates, please contact [facility] Please consult [email or web address] for contact info under Cardiology/STEMI.    Signed, Eula Listen, PA-C [facility] Pager: [phone] 05/22/2020, 8:34 AM

## 2020-05-22 NOTE — Consult Note (Signed)
Meds-To-Beds  Filled apixaban and entresto at [facility] pharmacy. Pt will have a 20 dollar co-pay for both medications each. Medications were delivered to the patient and pt was counseled on both medications prior to discharge.   Thanks,   Paschal Dopp, PharmD, BCPS

## 2020-05-22 NOTE — Discharge Summary (Addendum)
Physician Discharge Summary   Patient ID: Samuel [name] MRN: [id number] DOB/AGE: [birth year] 45 y.o.  Admit date: 05/16/2020 Discharge date: 05/22/2020  Primary Care Physician: Patient does not have PCP yet however plans to have the referral outpatient  Recommendations for Outpatient Follow-up:  1. BMET at the follow-up appointment with cardiology  Home Health: Now at baseline Equipment/Devices:   Discharge Condition: stable  CODE STATUS: FULL Diet recommendation: heart healthy    Discharge Diagnoses:    Atrial fibrillation with RVR, persistent, new onset Acute systolic CHF exacerbation Nonischemic cardiomyopathy Essential hypertension Alcohol use disorder Tobacco use History of cholelithiasis, hepatic steatosis Obesity   Consults: Cardiology    Allergies:   Allergies  Allergen Reactions  . Lactose Intolerance (Gi)      DISCHARGE MEDICATIONS: Allergies as of 05/22/2020      Reactions   Lactose Intolerance (gi)       Medication List    STOP taking these medications   azithromycin 250 MG tablet Commonly known as: ZITHROMAX   Charcoal Activated 280 MG Caps   GOODYS BODY PAIN PO     TAKE these medications   benzonatate 100 MG capsule Commonly known as: TESSALON Take 1 capsule (100 mg total) by mouth 3 (three) times daily as needed for cough.   cholecalciferol 25 MCG (1000 UNIT) tablet Commonly known as: VITAMIN D3 Take 1,000 Units by mouth daily.   DAYQUIL/NYQUIL COLD/FLU RELIEF PO Take 5 mLs by mouth See admin instructions. Dayquil once daily and Nyquil 2 to 3 times nightly   digoxin 0.125 MG tablet Commonly known as: LANOXIN Take 1 tablet (0.125 mg total) by mouth daily. Start taking on: May 23, 2020   Eliquis 5 MG Tabs tablet Generic drug: apixaban Take 1 tablet (5 mg total) by mouth 2 (two) times daily.   Entresto 24-26 MG Generic drug: sacubitril-valsartan Take 1 tablet by mouth 2 (two) times daily.   ezetimibe 10 MG  tablet Commonly known as: ZETIA Take 1 tablet (10 mg total) by mouth daily. Start taking on: May 23, 2020   fexofenadine 180 MG tablet Commonly known as: ALLEGRA Take 180 mg by mouth daily.   furosemide 40 MG tablet Commonly known as: LASIX Take 1 tablet (40 mg total) by mouth daily. And extra 1 dose as needed with weight changes Start taking on: May 23, 2020   Gas-X 80 MG chewable tablet Generic drug: simethicone Chew 80 mg by mouth every 6 (six) hours as needed for flatulence.   loperamide 2 MG capsule Commonly known as: IMODIUM Take 2 mg by mouth as needed for diarrhea or loose stools.   metoprolol succinate 100 MG 24 hr tablet Commonly known as: TOPROL-XL Take 1 tablet (100 mg total) by mouth daily. Take with or immediately following a meal. Start taking on: May 23, 2020   multivitamin with minerals Tabs tablet Take 1 tablet by mouth daily.   oxymetazoline 0.05 % nasal spray Commonly known as: AFRIN Place 1 spray into both nostrils 2 (two) times daily as needed for congestion.   pantoprazole 40 MG tablet Commonly known as: PROTONIX Take 1 tablet (40 mg total) by mouth daily. Start taking on: May 23, 2020   polyethylene glycol 17 g packet Commonly known as: MIRALAX / GLYCOLAX Take 17 g by mouth once.   spironolactone 25 MG tablet Commonly known as: ALDACTONE Take 0.5 tablets (12.5 mg total) by mouth daily. Start taking on: May 23, 2020   sucralfate 1 g tablet Commonly known as:  CARAFATE Take 1 g by mouth 4 (four) times daily.   vitamin C 100 MG tablet Take 100 mg by mouth daily.        Brief H and P: For complete details please refer to admission H and P, but in brief Patient is a 45 year old male with history of morbid obesity, alcohol use, tobacco use, was referred from GI office because of rapid heart rate and irregular rhythm.  Patient was complaining of epigastric pain, abdominal distention, palpitations and shortness of breath.  He was found to have  hypertensive urgency, acute systolic CHF and atrial fibrillation with RVR, heart rate in 160s.  Patient was treated with IV Cardizem infusion, IV heparin drip and IV Lasix. 2D echo showed EF 25 to 30%. Cardiology was consulted, underwent left heart cath which showed mild nonobstructive CAD. Outpatient follow-up with GI for fatty liver and cholelithiasis. -Cardioversion on 5/4  Hospital Course:    Atrial fibrillation with rapid ventricular response (HCC), persistent, new onset -Full TEE cardioversion on 5/4, maintaining normal sinus rhythm.  -Cardiology was consulted, continue metoprolol, digoxin -Continue Eliquis 5 mg twice daily   Acute systolic CHF exacerbation, nonischemic cardiomyopathy -Likely precipitated due to acute A. fib with RVR -2D echo showed EF 25 to 30% -Patient underwent cardiac cath showed mild nonobstructive CAD consistent with an ICM, possibly tach induced versus alcoholic. -Continue digoxin, Entresto, metoprolol -Transition to oral Lasix 40 mg daily, negative balance of 8.2 L -Weight down from 304lbs on admission to 289 at the time of discharge today  Essential hypertension -BP currently stable, continue metoprolol, Lasix, Entresto    Alcohol use disorder, moderate, dependence (HCC), tobacco use -Currently stable, recommended alcohol and tobacco cessation  History of cholelithiasis, hepatic steatosis, transaminitis -Continue outpatient follow-up with GI, LFTs improving  obesity (HCC) Estimated body mass index is 40.61 kg/m as calculated from the following:   Height as of this encounter: 5\' 11"  (1.803 m).   Weight as of this encounter: 132.1 kg.   Day of Discharge S: Feels good today, maintaining normal sinus rhythm, wants to go home.  Family at the bedside  BP 107/75 (BP Location: Left Arm)   Pulse 80   Temp 98 F (36.7 C) (Oral)   Resp 14   Ht 5\' 11"  (1.803 m)   Wt 131.1 kg   SpO2 96%   BMI 40.31 kg/m   Physical Exam: General: Alert and  awake oriented x3 not in any acute distress. CVS: S1-S2 clear no murmur rubs or gallops Chest: clear to auscultation bilaterally, no wheezing rales or rhonchi Abdomen: soft nontender, nondistended, normal bowel sounds Extremities: no cyanosis, clubbing or edema noted bilaterally Neuro:  no focal neurological deficits    Get Medicines reviewed and adjusted: Please take all your medications with you for your next visit with your Primary MD  Please request your Primary MD to go over all hospital tests and procedure/radiological results at the follow up. Please ask your Primary MD to get all Hospital records sent to his/her office.  If you experience worsening of your admission symptoms, develop shortness of breath, life threatening emergency, suicidal or homicidal thoughts you must seek medical attention immediately by calling 911 or calling your MD immediately  if symptoms less severe.  You must read complete instructions/literature along with all the possible adverse reactions/side effects for all the Medicines you take and that have been prescribed to you. Take any new Medicines after you have completely understood and accept all the possible adverse reactions/side effects.  Do not drive when taking pain medications.   Do not take more than prescribed Pain, Sleep and Anxiety Medications  Special Instructions: If you have smoked or chewed Tobacco  in the last 2 yrs please stop smoking, stop any regular Alcohol  and or any Recreational drug use.  Wear Seat belts while driving.  Please note  You were cared for by a hospitalist during your hospital stay. Once you are discharged, your primary care physician will handle any further medical issues. Please note that NO REFILLS for any discharge medications will be authorized once you are discharged, as it is imperative that you return to your primary care physician (or establish a relationship with a primary care physician if you do not have  one) for your aftercare needs so that they can reassess your need for medications and monitor your lab values.   The results of significant diagnostics from this hospitalization (including imaging, microbiology, ancillary and laboratory) are listed below for reference.      Procedures/Studies:  CT ABDOMEN PELVIS W CONTRAST  Result Date: 05/16/2020 CLINICAL DATA:  Nonlocalized acute abdominal pain. EXAM: CT ABDOMEN AND PELVIS WITH CONTRAST TECHNIQUE: Multidetector CT imaging of the abdomen and pelvis was performed using the standard protocol following bolus administration of intravenous contrast. CONTRAST:  OMNIPAQUE IOHEXOL 300 MG/ML  SOLN COMPARISON:  None. FINDINGS: Lower chest: Trace right pleural effusion. Hepatobiliary: The hepatic parenchyma is diffusely mildly hypodense compared to the splenic parenchyma consistent with fatty infiltration. Calcified gallstone noted within the gallbladder lumen. No gallbladder wall thickening or pericholecystic fluid. No biliary dilatation. Pancreas: Diffusely atrophic. No focal lesion. Otherwise normal pancreatic contour. No surrounding inflammatory changes. No main pancreatic ductal dilatation. Spleen: Normal in size without focal abnormality. Adrenals/Urinary Tract: No adrenal nodule bilaterally. Bilateral kidneys enhance symmetrically. No hydronephrosis. No hydroureter. The urinary bladder is unremarkable. On delayed imaging, there is no urothelial wall thickening and there are no filling defects in the opacified portions of the bilateral collecting systems or ureters. Stomach/Bowel: PO contrast reaches the transverse colon. Stomach is within normal limits. No evidence of bowel wall thickening or dilatation. Appendix appears normal. Vascular/Lymphatic: No abdominal aorta or iliac aneurysm. Mild atherosclerotic plaque of the aorta and its branches. No abdominal, pelvic, or inguinal lymphadenopathy. Reproductive: Prostate is unremarkable. Other: No  intraperitoneal free fluid. No intraperitoneal free gas. No organized fluid collection. Musculoskeletal: No acute or significant osseous findings. IMPRESSION: 1. Trace right pleural effusion 2. Cholelithiasis with no CT findings of acute cholecystitis tightest choledocholithiasis. 3. Mild hepatic steatosis. 4.  Aortic Atherosclerosis (ICD10-I70.0). Electronically Signed   By: Tish Frederickson M.D.   On: 05/16/2020 19:50   CARDIAC CATHETERIZATION  Result Date: 05/20/2020 Conclusions: 1. Mild, nonobstructive coronary artery disease including 20 to 30% ostial LCx and 20% mid/distal LCx stenoses.  Catheter induced vasospasm of the ostial/proximal RCA also noted, which resolved with intracoronary nitroglycerin. 2. Mildly elevated left heart and pulmonary artery pressures. 3. Moderately to severely elevated right heart filling pressures. 4. Moderately reduced cardiac output/index. Recommendations: 1. Continue IV diuresis. 2. Restart IV heparin 2 hours after TR band removal.  Transition to NOAC as soon as tomorrow if no evidence of bleeding or vascular injury. 3. Escalate goal-directed medical therapy of nonischemic cardiomyopathy, as tolerated. 4. Medical therapy and risk factor modification prevent progression of mild coronary artery disease. 5. Anticipate TEE/cardioversion tomorrow. Yvonne Kendall, MD [facility]   DG Chest Portable 1 View  Result Date: 05/16/2020 CLINICAL DATA:  Shortness of breath. EXAM: PORTABLE  CHEST 1 VIEW COMPARISON:  None. FINDINGS: The heart size appears to be generous. There are prominent interstitial lung markings bilaterally with blunting of the costophrenic angles bilaterally. There are streaky bibasilar airspace opacities suggestive of atelectasis. There is no pneumothorax. There is an old healed right clavicle fracture. IMPRESSION: Cardiomegaly with findings consistent with volume overload and possible developing interstitial edema. An atypical infectious process seems less  likely but is not entirely excluded. Electronically Signed   By: Katherine Mantlehristopher  Green M.D.   On: 05/16/2020 18:28   ECHOCARDIOGRAM COMPLETE  Result Date: 05/17/2020    ECHOCARDIOGRAM REPORT   Patient Name:   Gean Maidenshomas Bickett Musgrave II Date of Exam: 05/17/2020 Medical Rec #:  [id number]                 Height:       71.0 in Accession #:    [id number]                Weight:       304.0 lb Date of Birth:  January 26, 1975                 BSA:          2.519 m Patient Age:    44 years                  BP:           144/115 mmHg Patient Gender: M                         HR:           71 bpm. Exam Location:  [facility] Procedure: 2D Echo, Cardiac Doppler and Color Doppler Indications:     Atrial Fibrillation I48.91  History:         Patient has no prior history of Echocardiogram examinations.  Sonographer:     Neysa Bonitohristy Roar Referring Phys:  [id number] Andris BaumannHAZEL V DUNCAN Diagnosing Phys: Marcina MillardAlexander Paraschos MD IMPRESSIONS  1. Left ventricular ejection fraction, by estimation, is 25 to 30%. The left ventricle has severely decreased function. The left ventricle has no regional wall motion abnormalities. The left ventricular internal cavity size was mildly to moderately dilated. Left ventricular diastolic parameters are indeterminate.  2. Right ventricular systolic function is normal. The right ventricular size is normal.  3. The mitral valve is normal in structure. Mild to moderate mitral valve regurgitation. No evidence of mitral stenosis.  4. The aortic valve is normal in structure. Aortic valve regurgitation is not visualized. No aortic stenosis is present.  5. The inferior vena cava is normal in size with greater than 50% respiratory variability, suggesting right atrial pressure of 3 mmHg. FINDINGS  Left Ventricle: Left ventricular ejection fraction, by estimation, is 25 to 30%. The left ventricle has severely decreased function. The left ventricle has no regional wall motion abnormalities. The left ventricular internal cavity size was  mildly to moderately dilated. There is no left ventricular hypertrophy. Left ventricular diastolic parameters are indeterminate. Right Ventricle: The right ventricular size is normal. No increase in right ventricular wall thickness. Right ventricular systolic function is normal. Left Atrium: Left atrial size was normal in size. Right Atrium: Right atrial size was normal in size. Pericardium: There is no evidence of pericardial effusion. Mitral Valve: The mitral valve is normal in structure. Mild to moderate mitral valve regurgitation. No evidence of mitral valve stenosis. Tricuspid Valve: The tricuspid valve is normal in structure. Tricuspid valve  regurgitation is mild . No evidence of tricuspid stenosis. Aortic Valve: The aortic valve is normal in structure. Aortic valve regurgitation is not visualized. No aortic stenosis is present. Aortic valve peak gradient measures 5.7 mmHg. Pulmonic Valve: The pulmonic valve was normal in structure. Pulmonic valve regurgitation is not visualized. No evidence of pulmonic stenosis. Aorta: The aortic root is normal in size and structure. Venous: The inferior vena cava is normal in size with greater than 50% respiratory variability, suggesting right atrial pressure of 3 mmHg. IAS/Shunts: No atrial level shunt detected by color flow Doppler.  LEFT VENTRICLE PLAX 2D LVIDd:         6.03 cm  Diastology LVIDs:         5.45 cm  LV e' medial:  7.40 cm/s LV PW:         1.14 cm  LV e' lateral: 8.92 cm/s LV IVS:        0.99 cm LVOT diam:     2.00 cm LVOT Area:     3.14 cm  RIGHT VENTRICLE RV Mid diam:    2.68 cm RV S prime:     12.40 cm/s TAPSE (M-mode): 1.6 cm LEFT ATRIUM              Index       RIGHT ATRIUM           Index LA diam:        5.00 cm  1.98 cm/m  RA Area:     13.50 cm LA Vol (A2C):   85.3 ml  33.86 ml/m RA Volume:   27.40 ml  10.88 ml/m LA Vol (A4C):   115.0 ml 45.65 ml/m LA Biplane Vol: 102.0 ml 40.49 ml/m  AORTIC VALVE                PULMONIC VALVE AV Area (Vmax):  2.50 cm    PV Vmax:       0.95 m/s AV Vmax:        119.00 cm/s PV Peak grad:  3.6 mmHg AV Peak Grad:   5.7 mmHg LVOT Vmax:      94.70 cm/s  AORTA Ao Root diam: 2.70 cm TRICUSPID VALVE TR Peak grad:   20.2 mmHg TR Vmax:        225.00 cm/s  SHUNTS Systemic Diam: 2.00 cm Marcina Millard MD Electronically signed by Marcina Millard MD Signature Date/Time: 05/17/2020/2:27:44 PM    Final    ECHOCARDIOGRAM LIMITED  Result Date: 05/18/2020    ECHOCARDIOGRAM LIMITED REPORT   Patient Name:   Gean Maidens II Date of Exam: 05/18/2020 Medical Rec #:  [id number]                 Height:       71.0 in Accession #:    [id number]                Weight:       302.2 lb Date of Birth:  04/08/1975                 BSA:          2.513 m Patient Age:    44 years                  BP:           129/94 mmHg Patient Gender: M  HR:           98 bpm. Exam Location:  [facility] Procedure: Limited Echo and Intracardiac Opacification Agent Indications:     Atrial Fibrillation  History:         Patient has prior history of Echocardiogram examinations. Risk                  Factors:Morbid Obesity.  Sonographer:     L Thornton-Maynard Referring Phys:  [id number] Dois Davenport BERGE Diagnosing Phys: Marcina Millard MD  Sonographer Comments: Image acquisition challenging due to patient body habitus. IMPRESSIONS  1. Left ventricular ejection fraction, by estimation, is 30 to 35%. The left ventricle has moderately decreased function. The left ventricle demonstrates global hypokinesis. The left ventricular internal cavity size was moderately dilated.  2. Right ventricular systolic function is normal. The right ventricular size is normal.  3. The mitral valve is normal in structure. No evidence of mitral valve regurgitation. No evidence of mitral stenosis.  4. The aortic valve is normal in structure. Aortic valve regurgitation is not visualized. No aortic stenosis is present.  5. The inferior vena cava is normal in size  with greater than 50% respiratory variability, suggesting right atrial pressure of 3 mmHg. FINDINGS  Left Ventricle: Left ventricular ejection fraction, by estimation, is 30 to 35%. The left ventricle has moderately decreased function. The left ventricle demonstrates global hypokinesis. Definity contrast agent was given IV to delineate the left ventricular endocardial borders. The left ventricular internal cavity size was moderately dilated. There is no left ventricular hypertrophy. Right Ventricle: The right ventricular size is normal. No increase in right ventricular wall thickness. Right ventricular systolic function is normal. Left Atrium: Left atrial size was normal in size. Right Atrium: Right atrial size was normal in size. Pericardium: There is no evidence of pericardial effusion. Mitral Valve: The mitral valve is normal in structure. No evidence of mitral valve stenosis. Tricuspid Valve: The tricuspid valve is normal in structure. Tricuspid valve regurgitation is not demonstrated. No evidence of tricuspid stenosis. Aortic Valve: The aortic valve is normal in structure. Aortic valve regurgitation is not visualized. No aortic stenosis is present. Pulmonic Valve: The pulmonic valve was normal in structure. Pulmonic valve regurgitation is not visualized. No evidence of pulmonic stenosis. Aorta: The aortic root is normal in size and structure. Venous: The inferior vena cava is normal in size with greater than 50% respiratory variability, suggesting right atrial pressure of 3 mmHg. IAS/Shunts: No atrial level shunt detected by color flow Doppler. LEFT VENTRICLE PLAX 2D LVIDd:         6.42 cm LVIDs:         5.45 cm LV PW:         0.94 cm LV IVS:        1.00 cm  LV Volumes (MOD) LV vol d, MOD A2C: 152.0 ml LV vol d, MOD A4C: 209.0 ml LV vol s, MOD A2C: 104.0 ml LV vol s, MOD A4C: 138.0 ml LV SV MOD A2C:     48.0 ml LV SV MOD A4C:     209.0 ml LV SV MOD BP:      57.2 ml Marcina Millard MD Electronically signed  by Marcina Millard MD Signature Date/Time: 05/18/2020/1:38:53 PM    Final        LAB RESULTS: Basic Metabolic Panel: Recent Labs  Lab 05/18/20 0514 05/19/20 1105 05/21/20 0855 05/22/20 0421  NA 139   < > 139 142  K 3.8   < >  4.1 3.6  CL 105   < > 108 108  CO2 24   < > 21* 23  GLUCOSE 130*   < > 113* 145*  BUN 18   < > 25* 23*  CREATININE 1.07   < > 0.98 1.00  CALCIUM 9.0   < > 9.4 9.6  MG 2.2  --   --   --    < > = values in this interval not displayed.   Liver Function Tests: Recent Labs  Lab 05/18/20 0514 05/19/20 1105  AST 46* 44*  ALT 104* 101*  ALKPHOS 47 51  BILITOT 1.1 1.2  PROT 7.4 8.3*  ALBUMIN 4.1 4.5   Recent Labs  Lab 05/16/20 1522  LIPASE 28   No results for input(s): AMMONIA in the last 168 hours. CBC: Recent Labs  Lab 05/21/20 0440 05/22/20 0421  WBC 11.0* 10.6*  HGB 16.3 15.8  HCT 50.9 47.0  MCV 102.4* 97.5  PLT 348 359   Cardiac Enzymes: No results for input(s): CKTOTAL, CKMB, CKMBINDEX, TROPONINI in the last 168 hours. BNP: Invalid input(s): POCBNP CBG: No results for input(s): GLUCAP in the last 168 hours.     Disposition and Follow-up: Discharge Instructions    (HEART FAILURE PATIENTS) Call MD:  Anytime you have any of the following symptoms: 1) 3 pound weight gain in 24 hours or 5 pounds in 1 week 2) shortness of breath, with or without a dry hacking cough 3) swelling in the hands, feet or stomach 4) if you have to sleep on extra pillows at night in order to breathe.   Complete by: As directed    Amb referral to [facility] Clinic   Complete by: As directed    Diet - low sodium heart healthy   Complete by: As directed    Discharge instructions   Complete by: As directed    Continue lasix 40mg  daily. Take 1 extra dose for >5lb weight gain, swelling or shortness of breath   Increase activity slowly   Complete by: As directed        DISPOSITION: *home    DISCHARGE FOLLOW-UP  Follow-up Information    Antonieta Iba,  MD. Go on 06/02/2020.   Specialty: Cardiology Why: @3 :00pm Contact information: [address] [place] [address] [phone]                Time coordinating discharge:  35 mins    Signed:   Thad Ranger M.D. [facility] 05/22/2020, 1:10 PM

## 2020-05-22 NOTE — Plan of Care (Signed)

## 2020-05-23 ENCOUNTER — Encounter: Payer: Self-pay | Admitting: Cardiology

## 2020-05-26 ENCOUNTER — Telehealth: Payer: Self-pay

## 2020-05-26 NOTE — Telephone Encounter (Signed)
Post hospital discharge phone call   Spoke with patient, he states that he feels that he is doing well, he has not had any increasing shortness of breath and weight is down 2-1/2 pounds since he is left the hospital.  He is weight this morning was 289-1/2 pounds, the same as yesterday.  Over his medication list and he is taking as prescribed, had over the weekend taken a second dose of the furosemide in the afternoon but today is just going to once a day as his instructions.  He denies any dizziness, lightheadedness, increasing shortness of breath palpitations etc.  He has an appointment to follow-up with Clarisa Kindred in the heart failure clinic this Wednesday at 9 AM and he has follow-up appointment with Dr. Mariah Milling on May 16.  Tresa Endo RN [facility]

## 2020-05-28 ENCOUNTER — Ambulatory Visit (HOSPITAL_BASED_OUTPATIENT_CLINIC_OR_DEPARTMENT_OTHER): Payer: BLUE CROSS/BLUE SHIELD | Admitting: Family

## 2020-05-28 ENCOUNTER — Encounter: Payer: Self-pay | Admitting: Family

## 2020-05-28 ENCOUNTER — Other Ambulatory Visit
Admission: RE | Admit: 2020-05-28 | Discharge: 2020-05-28 | Disposition: A | Discharge status: Home / Self Care | Payer: BLUE CROSS/BLUE SHIELD | Source: Ambulatory Visit | Attending: Family | Admitting: Family

## 2020-05-28 ENCOUNTER — Other Ambulatory Visit: Payer: Self-pay

## 2020-05-28 VITALS — BP 139/93 | HR 88 | Resp 20 | Ht 71.0 in | Wt 290.0 lb

## 2020-05-28 DIAGNOSIS — I509 Heart failure, unspecified: Secondary | ICD-10-CM | POA: Insufficient documentation

## 2020-05-28 DIAGNOSIS — I2510 Atherosclerotic heart disease of native coronary artery without angina pectoris: Secondary | ICD-10-CM | POA: Diagnosis not present

## 2020-05-28 DIAGNOSIS — I110 Hypertensive heart disease with heart failure: Secondary | ICD-10-CM | POA: Insufficient documentation

## 2020-05-28 DIAGNOSIS — Z6841 Body Mass Index (BMI) 40.0 and over, adult: Secondary | ICD-10-CM | POA: Diagnosis not present

## 2020-05-28 DIAGNOSIS — I4891 Unspecified atrial fibrillation: Secondary | ICD-10-CM | POA: Insufficient documentation

## 2020-05-28 DIAGNOSIS — Z87891 Personal history of nicotine dependence: Secondary | ICD-10-CM | POA: Insufficient documentation

## 2020-05-28 DIAGNOSIS — Z8249 Family history of ischemic heart disease and other diseases of the circulatory system: Secondary | ICD-10-CM | POA: Insufficient documentation

## 2020-05-28 DIAGNOSIS — Z7901 Long term (current) use of anticoagulants: Secondary | ICD-10-CM | POA: Insufficient documentation

## 2020-05-28 DIAGNOSIS — I480 Paroxysmal atrial fibrillation: Secondary | ICD-10-CM

## 2020-05-28 DIAGNOSIS — I5022 Chronic systolic (congestive) heart failure: Secondary | ICD-10-CM

## 2020-05-28 DIAGNOSIS — I10 Essential (primary) hypertension: Secondary | ICD-10-CM

## 2020-05-28 LAB — BASIC METABOLIC PANEL
Anion gap: 9 (ref 5–15)
BUN: 14 mg/dL (ref 6–20)
CO2: 27 mmol/L (ref 22–32)
Calcium: 9.6 mg/dL (ref 8.9–10.3)
Chloride: 105 mmol/L (ref 98–111)
Creatinine, Ser: 0.98 mg/dL (ref 0.61–1.24)
GFR, Estimated: >60 mL/min (ref >60)
Glucose, Bld: 114 mg/dL — ABNORMAL HIGH (ref 70–99)
Potassium: 4.6 mmol/L (ref 3.5–5.1)
Sodium: 141 mmol/L (ref 135–145)
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
## 2020-05-28 NOTE — Patient Instructions (Signed)
Continue weighing daily and call for an overnight weight gain of > 2 pounds or a weekly weight gain of >5 pounds. 
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
## 2020-06-02 ENCOUNTER — Ambulatory Visit: Payer: BLUE CROSS/BLUE SHIELD | Admitting: Medical

## 2020-06-02 ENCOUNTER — Telehealth: Payer: Self-pay | Admitting: Medical

## 2020-06-02 NOTE — Telephone Encounter (Signed)
Pt c/o medication issue:  1. Name of Medication: All   2. How are you currently taking this medication (dosage and times per day)?   3. Are you having a reaction (difficulty breathing--STAT)?    4. What is your medication issue? Patient cancelled appt for now with [facility] as he was not aware insurance oon.  Patient aware to contact insurance for network questions and advice.  Patient asked what he should do about medications after recent hospital visit.

## 2020-06-02 NOTE — Telephone Encounter (Signed)
Please see message below. Pt has not been seen in our office previously.  Please advise.
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
## 2020-06-13 NOTE — Telephone Encounter (Signed)
Pt discharged from hospital 05/22/20.  Attempted to call pt to discuss.  Will determine if pt has follow up elsewhere post hospital.  If not, will ask provider in office if able to send in 30 days of meds to cover until pt finds in network follow up.   Pt did not answer. Lmtcb.

## 2020-06-26 ENCOUNTER — Telehealth: Payer: Self-pay | Admitting: Family

## 2020-06-26 NOTE — Telephone Encounter (Signed)
Patient called and asked to cancel all future appointments with the [facility] as he is now being seen at [facility] and his insurance doesn't cover care here per patients statement.   Diondra Pines, NT

## 2020-06-30 ENCOUNTER — Ambulatory Visit: Payer: BLUE CROSS/BLUE SHIELD | Admitting: Family

## 2022-04-10 IMAGING — CT CT ABD-PELV W/ CM
2 of 5 series · 16 of 46 positions shown, 18 images · IV contrast (APPLIED)
Comparison: None.

CLINICAL DATA: Nonlocalized acute abdominal pain.

EXAM:
CT ABDOMEN AND PELVIS WITH CONTRAST
TECHNIQUE: Multidetector CT imaging of the abdomen and pelvis was performed
using the standard protocol following bolus administration of
intravenous contrast.
CONTRAST:  125mL OMNIPAQUE IOHEXOL 300 MG/ML  SOLN

[Series 2: routine abd/pel with · axial · 0.98mm/px · z∈[-1085,-645]mm · 13 of 98 slices shown, 15 images]
[im 5/98  soft-tissue]
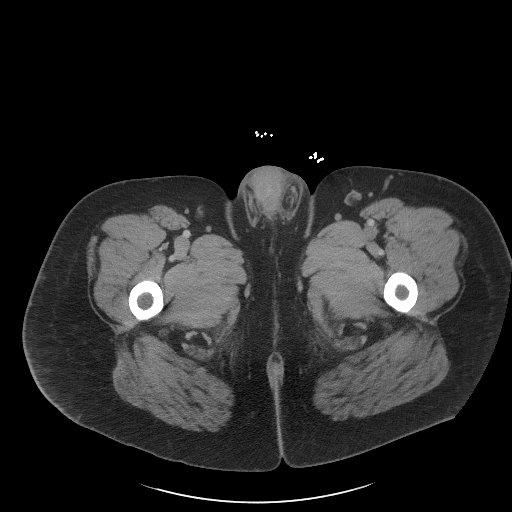
[im 5/98  bone]
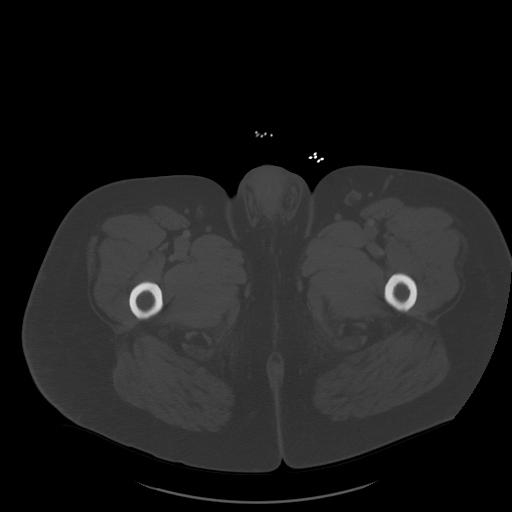
[im 15/98  soft-tissue]
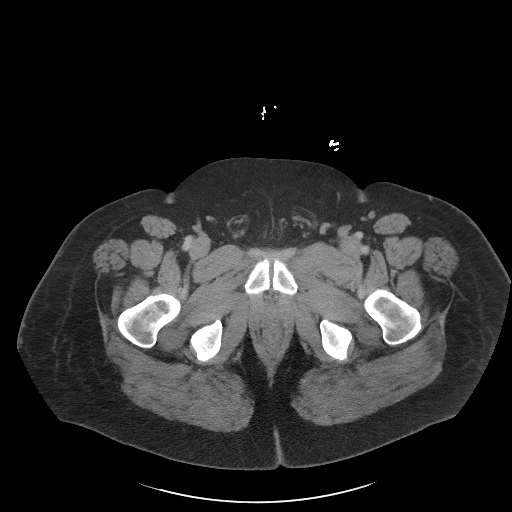
[im 20/98  soft-tissue]
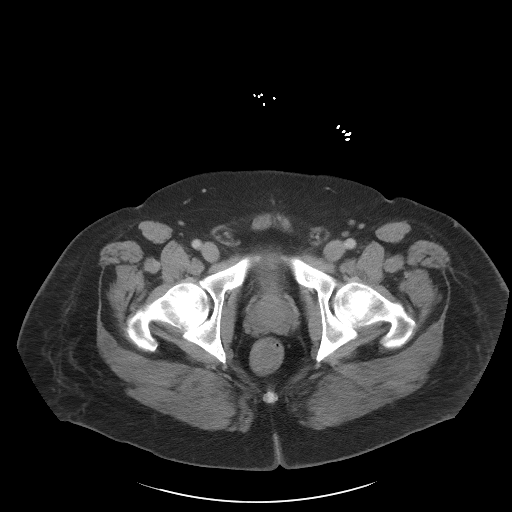
[im 30/98  soft-tissue]
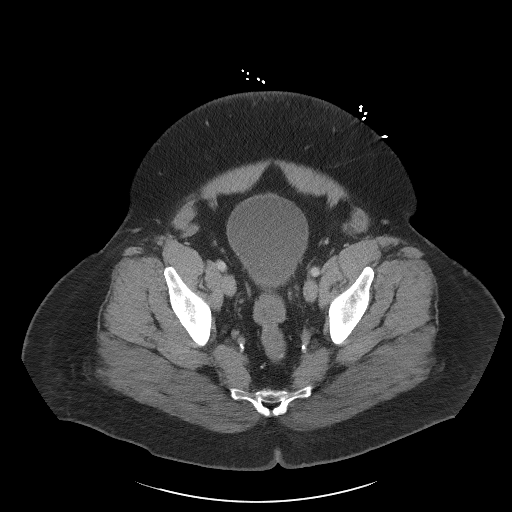
[im 34/98  soft-tissue]
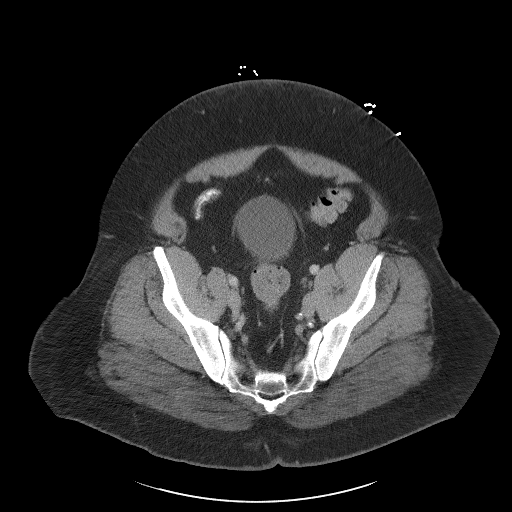
[im 44/98  soft-tissue]
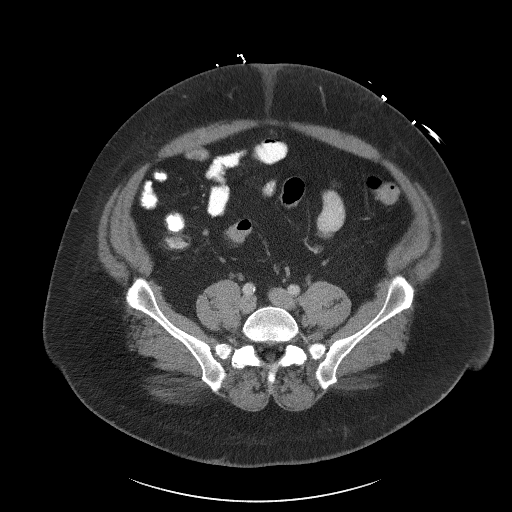
[im 49/98  soft-tissue]
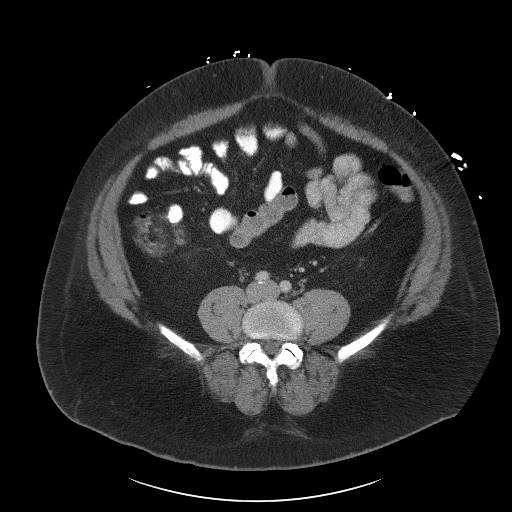
[im 54/98  soft-tissue]
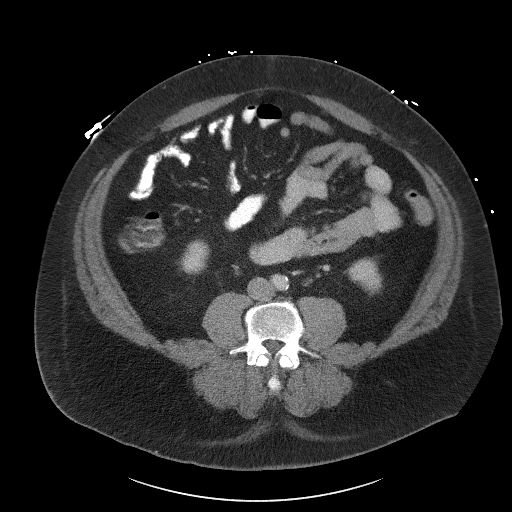
[im 64/98  soft-tissue]
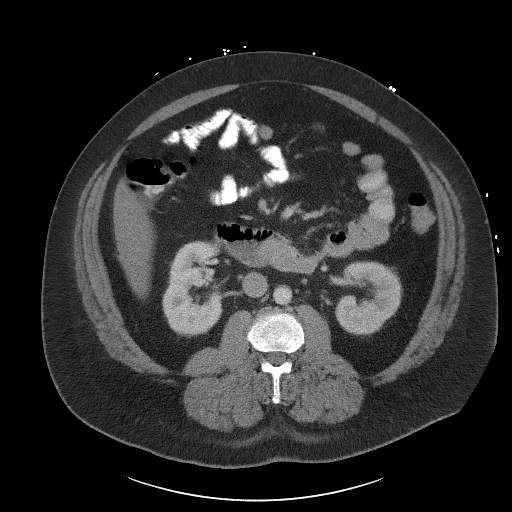
[im 64/98  bone]
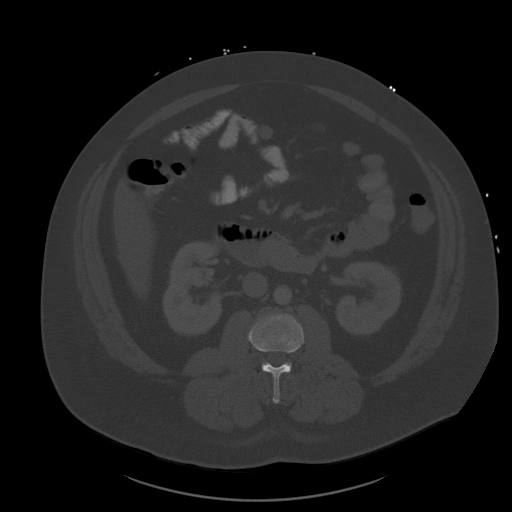
[im 68/98  soft-tissue]
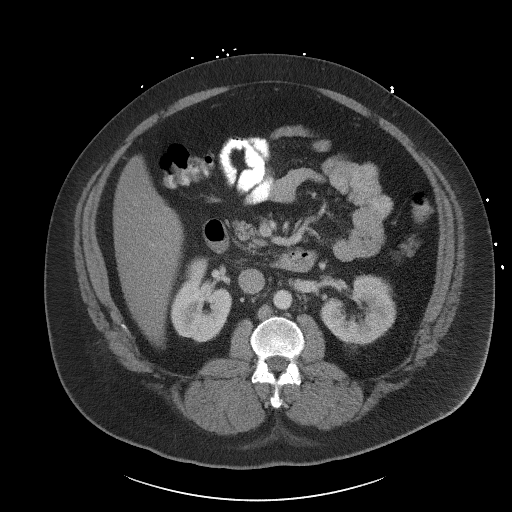
[im 78/98  soft-tissue]
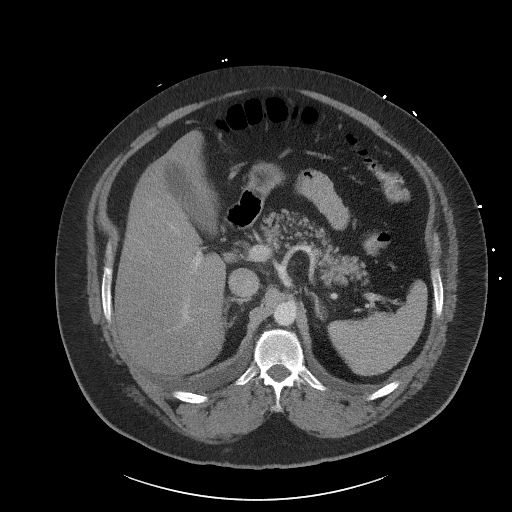
[im 83/98  soft-tissue]
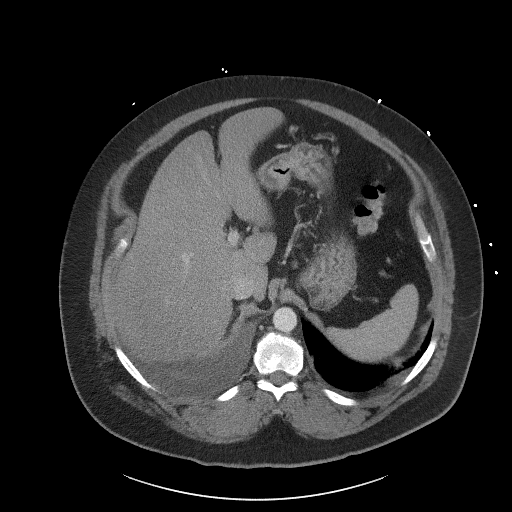
[im 93/98  soft-tissue]
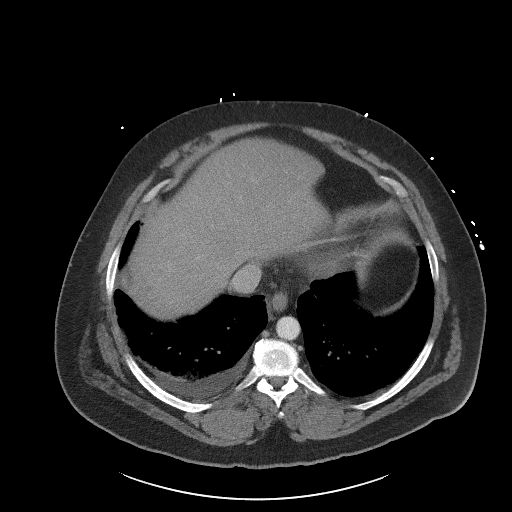

[Series 5: coronal st · coronal · 0.95mm/px · 3 of 135 slices shown]
[im 45/135  soft-tissue]
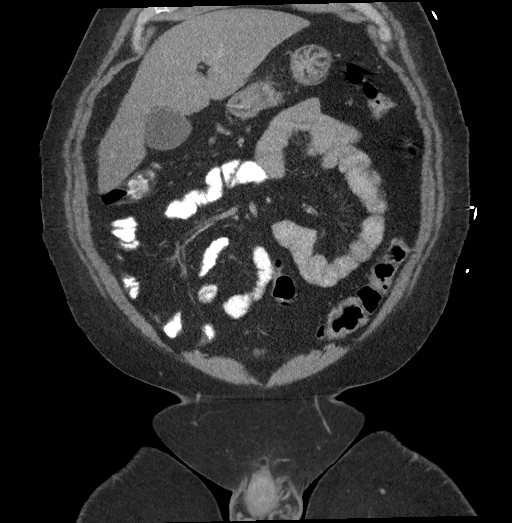
[im 60/135  soft-tissue]
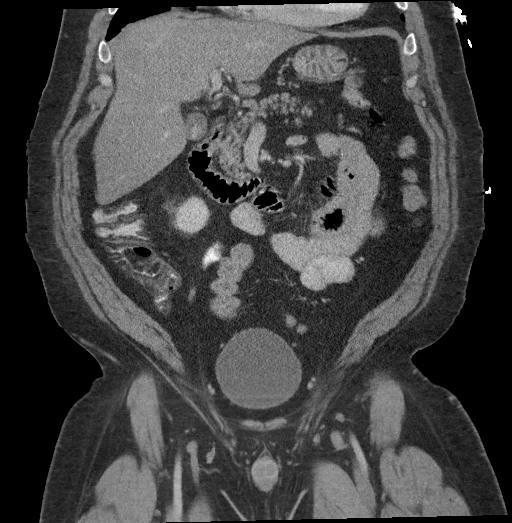
[im 75/135  soft-tissue]
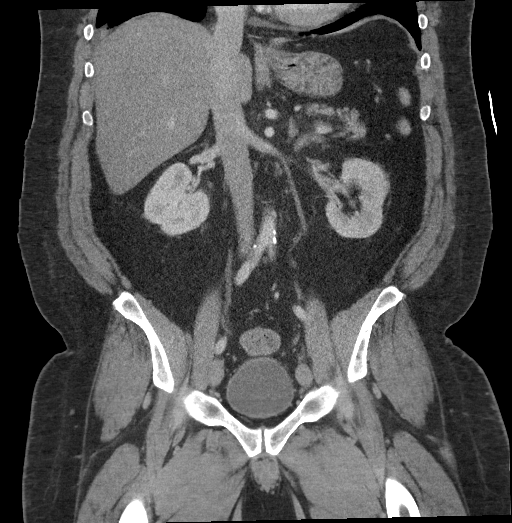

[16 of 46 positions shown; findings below may reference images not displayed]

FINDINGS: Lower chest: Trace right pleural effusion.

Hepatobiliary: The hepatic parenchyma is diffusely mildly hypodense
compared to the splenic parenchyma consistent with fatty
infiltration. Calcified gallstone noted within the gallbladder
lumen. No gallbladder wall thickening or pericholecystic fluid. No
biliary dilatation.

Pancreas: Diffusely atrophic. No focal lesion. Otherwise normal
pancreatic contour. No surrounding inflammatory changes. No main
pancreatic ductal dilatation.

Spleen: Normal in size without focal abnormality.

Adrenals/Urinary Tract:

No adrenal nodule bilaterally.

Bilateral kidneys enhance symmetrically. No hydronephrosis. No
hydroureter.

The urinary bladder is unremarkable.

On delayed imaging, there is no urothelial wall thickening and there
are no filling defects in the opacified portions of the bilateral
collecting systems or ureters.

Stomach/Bowel: PO contrast reaches the transverse colon. Stomach is
within normal limits. No evidence of bowel wall thickening or
dilatation. Appendix appears normal.

Vascular/Lymphatic: No abdominal aorta or iliac aneurysm. Mild
atherosclerotic plaque of the aorta and its branches. No abdominal,
pelvic, or inguinal lymphadenopathy.

Reproductive: Prostate is unremarkable.

Other: No intraperitoneal free fluid. No intraperitoneal free gas.
No organized fluid collection.

Musculoskeletal: No acute or significant osseous findings.
IMPRESSION: 1. Trace right pleural effusion
2. Cholelithiasis with no CT findings of acute cholecystitis
tightest choledocholithiasis.
3. Mild hepatic steatosis.
4.  Aortic Atherosclerosis ([id number]).

## 2023-05-18 DIAGNOSIS — M659 Unspecified synovitis and tenosynovitis, unspecified site: Secondary | ICD-10-CM | POA: Diagnosis not present

## 2023-05-18 DIAGNOSIS — M25512 Pain in left shoulder: Secondary | ICD-10-CM | POA: Diagnosis not present

## 2023-05-26 DIAGNOSIS — R7881 Bacteremia: Secondary | ICD-10-CM | POA: Diagnosis not present

## 2023-05-26 DIAGNOSIS — L089 Local infection of the skin and subcutaneous tissue, unspecified: Secondary | ICD-10-CM | POA: Diagnosis not present

## 2023-05-26 DIAGNOSIS — D72829 Elevated white blood cell count, unspecified: Secondary | ICD-10-CM | POA: Diagnosis not present

## 2023-06-11 DIAGNOSIS — L089 Local infection of the skin and subcutaneous tissue, unspecified: Secondary | ICD-10-CM | POA: Diagnosis not present

## 2023-06-11 DIAGNOSIS — S61432A Puncture wound without foreign body of left hand, initial encounter: Secondary | ICD-10-CM | POA: Diagnosis not present

## 2023-06-12 DIAGNOSIS — L089 Local infection of the skin and subcutaneous tissue, unspecified: Secondary | ICD-10-CM | POA: Diagnosis not present

## 2023-06-12 DIAGNOSIS — S61432A Puncture wound without foreign body of left hand, initial encounter: Secondary | ICD-10-CM | POA: Diagnosis not present

## 2023-06-13 DIAGNOSIS — S61432A Puncture wound without foreign body of left hand, initial encounter: Secondary | ICD-10-CM | POA: Diagnosis not present

## 2023-06-13 DIAGNOSIS — L089 Local infection of the skin and subcutaneous tissue, unspecified: Secondary | ICD-10-CM | POA: Diagnosis not present

## 2023-06-14 DIAGNOSIS — L089 Local infection of the skin and subcutaneous tissue, unspecified: Secondary | ICD-10-CM | POA: Diagnosis not present

## 2023-06-14 DIAGNOSIS — S61432A Puncture wound without foreign body of left hand, initial encounter: Secondary | ICD-10-CM | POA: Diagnosis not present

## 2023-06-15 DIAGNOSIS — S61432A Puncture wound without foreign body of left hand, initial encounter: Secondary | ICD-10-CM | POA: Diagnosis not present

## 2023-06-15 DIAGNOSIS — L089 Local infection of the skin and subcutaneous tissue, unspecified: Secondary | ICD-10-CM | POA: Diagnosis not present

## 2023-06-16 DIAGNOSIS — S61432A Puncture wound without foreign body of left hand, initial encounter: Secondary | ICD-10-CM | POA: Diagnosis not present

## 2023-06-16 DIAGNOSIS — L089 Local infection of the skin and subcutaneous tissue, unspecified: Secondary | ICD-10-CM | POA: Diagnosis not present

## 2023-06-17 DIAGNOSIS — S61432A Puncture wound without foreign body of left hand, initial encounter: Secondary | ICD-10-CM | POA: Diagnosis not present

## 2023-06-17 DIAGNOSIS — L089 Local infection of the skin and subcutaneous tissue, unspecified: Secondary | ICD-10-CM | POA: Diagnosis not present

## 2023-07-11 ENCOUNTER — Ambulatory Visit: Admitting: Occupational Therapy

## 2023-08-10 DIAGNOSIS — L03114 Cellulitis of left upper limb: Secondary | ICD-10-CM | POA: Diagnosis not present

## 2023-08-10 DIAGNOSIS — M1812 Unilateral primary osteoarthritis of first carpometacarpal joint, left hand: Secondary | ICD-10-CM | POA: Diagnosis not present

## 2023-08-10 DIAGNOSIS — B958 Unspecified staphylococcus as the cause of diseases classified elsewhere: Secondary | ICD-10-CM | POA: Diagnosis not present

## 2023-09-14 NOTE — Progress Notes (Signed)
 [facility]  INFECTIOUS DISEASE OUTPATIENT PROGRESS NOTE ( [place])             Samuel [name] Date of Birth: 1975-02-09 MRN: [id number] Patient seen and examined by me on 09/15/2023       Reason for follow up: Leukocytosis   Assessment and Plan: 1.  Persistent leukocytosis 2.  History of cellulitis of the left hand status post I&D on 05/25/2023 history of Staphylococcus aureus bacteremia completed a 4-week course of IV antibiotic therapy on June 24, 2023  His white blood cell count has fluctuated over the last 2 weeks.  Most recently noted to be 15.30.  He denies fever or chills, denies respiratory symptoms.  Denies urinary symptoms.  Does not report weight loss.  Does not have a history of underlying hematological malignancy.  He does have a history of cellulitis of the left hand, underwent I&D on 05/25/2023.  He was noted to have Staphylococcus aureus bacteremia and completed a 4-week course of IV antibiotic therapy in June 24, 2023.  His left hand surgical site is well-healed.  He recently he had discontinued his NSAIDs leading to increased pain in the left hand.  He is currently back on NSAIDs with improvement noted in his symptoms.  He does have some degree of swelling in his left hand.  I will obtain a CBC with differential today.  Will obtain blood cultures.  We will also obtain an x-ray of his left hand.   Please note that those non-infectious problems listed above as active problems directly impact the patient's risk for infection, ability to respond to treatment and clear infections, or impact the selection or dosing of antibiotics, or are directly influenced by infection, treatment of infection, or relate to monitoring of infection and/or potential toxicities of antimicrobial therapy.  Chief complaint:  Elevated white blood cell count  History of presenting illness: He has a past medical history there is significant atrial fibrillation, heart failure and  hypertension. He was admitted on 05/25/2023 with pain pain and redness in the left hand. He underwent steroid injection for left thumb synovitis on 05/18/2023.S[facility]RA MRI of the hand showed early degenerative changes and marked edema.  He was noted to have Staphylococcus aureus bacteremia.  He was taken to surgery on 05/25/2023 and underwent I&D.  There was no evidence of gross infection at the first MCP joint.  However cultures were noted to be positive for Staphylococcus aureus.  He was transition to cefazolin  2 g every 8 hours until June 24, 2023 to complete a 4-week course of treatment with resolution of the infection.  His follow-up blood cultures from 06/29/2023 were negative.  Over the last few weeks he has been noted to have an elevated white blood cell count.  On 08/30/2023 it was noted to be 14.8.  On 09/07/2023 it was noted to be 15.30.  He reports that his left hand is overall healed.  However he notes that he stopped his NSAIDs recently due to concern for a gastric ulcer.  He underwent EGD and colonoscopy.  He is currently back on his NSAIDs and notes improvement in his left hand pain.  He denies urinary symptoms.  He denies cough productive sputum.  He denies other GI symptoms  Medications:  Current Antibiotic Drug: Start Date: End date: Duration/Other:                   Current Medications[1]   Allergies: Animal dander and Milk  Medical History[2]  Surgical History[3]  Social History  Social:  Social History   Tobacco Use  . Smoking status: Some Days    Current packs/day: 0.25    Average packs/day: 0.3 packs/day for 15.7 years (3.9 ttl pk-yrs)    Types: Cigars, Cigarettes    Start date: 01/19/2008  . Smokeless tobacco: Never  Substance Use Topics  . Alcohol use: Yes    Alcohol/week: 7.0 standard drinks of alcohol    Types: 7 Standard drinks or equivalent per week    Comment: Occ.      Family History  Family history has been reviewed  Family History[4]    Review of  systems: Gen: Denies fevers chills HEENT: Denies sore throat or difficulty swallowing, denies vision problems Neck: Denies Neck pain or neck stiffness Cardiovascular: Denies chest pain or palpitations Respiratory: Denies cough, denies productive sputum.  Denies chest pain, No SOB Gastrointestinal: Denies diarrhea.  Denies vomiting.  Denies abdominal pain Genitourinary: Denies dysuria, denies hematuria.  Denies urethral discharge Skin: Denies skin rash   Denies itching Musculoskeletal: Denies joint pain denies joint swelling.  Denies pain or redness in the extremities CNS: Denies headache.  Denies vision problems.  Denies weakness or numbness in any extremity Endocrine: Denies increased urinary frequency.  Denies fatigue Hematological: Denies bleeding tendency   Objective  There is no height or weight on file to calculate BMI.  Vital signs last 24 hours: Vitals:   09/15/23 1310 09/15/23 1316  BP: (!) 135/90 133/87  BP Location: Left arm Right arm  Patient Position: Sitting   Pulse: 98   Resp: 18   Temp: 98.3 F (36.8 C)   TempSrc: Oral   SpO2: 98%   Weight: (!) 141 kg (310 lb 6.4 oz)   Height: 1.803 m (5' 11)       Physical Exam: General appearance - alert, well appearing, and in no distress and oriented to person, place, and time Mouth - mucous membranes moist, pharynx normal without lesions. Neck  -supple Endocrine-no thyroid enlargement Chest - clear to auscultation, no wheezes, rales or rhonchi, symmetric air entry Heart - normal rate and regular rhythm Abdomen - soft, nontender, nondistended, no masses or organomegaly Neurological - alert, oriented, normal speech, no focal findings or movement disorder noted Extremities - no pedal edema noted Wound examination: Left hand surgical site is well-healed.  Swelling noted on the palmar aspect of his hand.  It is not fluctuant or erythematous  Labs:   No results for input(s): WBC, HGB, HCT, PLT, NEUTOPHILPCT,  EOSPCT in the last 72 hours. No results for input(s): NA, K, CL, CO2, BUN, CREATININE, CALCIUM, PROT, BILITOT, ALP, ALT, AST, GLUCOSE in the last 72 hours.  Lab Results  Component Value Date   NA 140 07/19/2023   NA 142 06/15/2023   K 4.2 07/19/2023   K 4.2 06/15/2023   CL 105 07/19/2023   CL 104 06/15/2023   CO2 25 07/19/2023   CO2 25 06/05/2023   BUN 12 07/19/2023   BUN 16 06/05/2023   GLUCOSE 94 07/19/2023   GLUCOSE 109 (H) 06/05/2023   CREATININE 0.80 07/19/2023   CREATININE 0.81 06/15/2023   CALCIUM 9.1 07/19/2023   CALCIUM 9.0 06/05/2023   PROT 6.9 07/19/2023   PROT 7.4 05/25/2023   ALBUMIN 4.2 07/19/2023   ALBUMIN 4.3 05/25/2023   BILITOT 0.8 07/19/2023   BILITOT 0.9 05/25/2023   ALP 54 07/19/2023   ALP 47 05/25/2023   AST 11 (L) 07/19/2023   AST  16 06/15/2023   ALT 16 07/19/2023   ALT 13 06/15/2023   ANIONGAP 10 07/19/2023   ANIONGAP 5 (L) 06/05/2023    Lab Results  Component Value Date   WBC 15.30 (H) 09/07/2023   WBC 14.80 (H) 08/30/2023   WBC 13.50 (H) 07/26/2023   CREATININE 0.80 07/19/2023   CREATININE 0.81 06/15/2023   CREATININE 0.89 06/07/2023    No results found for: COLORU, SPECGRAV, GLUCOSEU, KETONEU, BLOODU, LEUKOUA, PHUR, ALBUR, BILIRUBINUR, UROBILINOGEN, RBCU, WBCU, BACTERIA, ERYTHROBM    Microbiology: No results found for this visit on 09/15/23 (from the past week).   Imaging ( personally reviewed by me):   Endo Colon Addendum Date: 08/26/2023 Staff Role [place] E Harris, RN Endo Nurse Delon Westly Gola, CRNA CRNA Hoy Earnie Amour, MD Proceduralist Cena Hy, MD Anesthesiologist Indication Nausea, Generalized abdominal pain, Change in bowel habit  1st screening colonoscopy Medications See Anesthesia Record. Preprocedure A history and physical has been performed, and patient medication allergies have been reviewed. The patient's tolerance of previous anesthesia has been  reviewed. The risks and benefits of the procedure and the sedation options and risks were discussed with the patient. All questions were answered and informed consent obtained. Details of the Procedure The patient underwent monitored anesthesia care, which was administered by an anesthesia professional. The patient's blood pressure, ECG, ETCO2, heart rate, level of consciousness, oxygen and respirations were monitored throughout the procedure. A digital rectal exam was performed. The scope was introduced through the anus and advanced to the terminal ileum. Retroflexion was performed in the rectum. The quality of bowel preparation was evaluated using the [facility] Bowel Preparation Scale with scores of: right colon = 2, transverse colon = 3, left colon = 2. The total BBPS score was 7. Bowel prep was adequate. The patient's estimated blood loss was minimal. The procedure was not difficult. The patient tolerated the procedure well. There were no apparent adverse events. Events Procedure Events Event Event Time UPPER SCOPE IN TIME 08/23/2023  8:55 AM UPPER SCOPE OUT TIME 08/23/2023  9:06 AM LOWER SCOPE IN TIME 08/23/2023  9:17 AM ENDO CECUM REACHED 08/23/2023  9:19 AM LOWER SCOPE OUT TIME 08/23/2023  9:40 AM Findings Four sessile polyps measuring smaller than 5 mm in the ascending colon; performed cold snare with complete en bloc removal and retrieved specimen One sessile polyp measuring 5-9 mm in the ascending colon; performed cold snare with complete en bloc removal and retrieved specimen Internal medium hemorrhoids Performed multiple random forceps biopsies in the ascending colon and descending colon to rule out microscopic colitis -TI appeared normal for 5 cm Impression 5 polyps in the ascending colon were removed with cold snare Internal medium hemorrhoids Performed forceps biopsies in the ascending colon and descending colon to rule out microscopic colitis Recommendation Repeat colonoscopy in 3 years, due: 08/22/2026 High fiber  diet  Follow up with PCP  -Follow up with referring provider as needed PATHOLOGY ADDENDUM: Final Diagnosis  B.  Colon polyps, ascending, endoscopic polypectomy: Tubular adenomas, three fragments.  C.  Colon, endoscopic biopsy: Colonic mucosa with no significant pathologic alteration. No active or chronic colitis is seen. No evidence of microscopic colitis is seen.  Post Procedure Diagnosis None  Specimens ID Type Source Tests Collected by Time 1 : RO H PYLORI Tissue Stomach, Random TISSUE EXAM Hoy Earnie Amour, MD 08/23/2023 [id number] 2 : multiple Tissue Colon Polyp, Right/Ascending TISSUE EXAM Hoy Earnie Amour, MD 08/23/2023 [phone] 3 : RO microscopic colitis Tissue Colon, Random TISSUE EXAM Hoy  Earnie Amour, MD 08/23/2023 0930 Referring Provider Devona Lucie Sherrilyn SQUIBB* Last Colonoscopy The patient doesn't have any registry metric data available   Addendum Date: 08/23/2023 Staff Role [place] E Harris, RN Endo Nurse Delon Westly Gola, CRNA CRNA Hoy Earnie Amour, MD Proceduralist Cena Hy, MD Anesthesiologist Indication Nausea, Generalized abdominal pain, Change in bowel habit  1st screening colonoscopy Medications See Anesthesia Record. Preprocedure A history and physical has been performed, and patient medication allergies have been reviewed. The patient's tolerance of previous anesthesia has been reviewed. The risks and benefits of the procedure and the sedation options and risks were discussed with the patient. All questions were answered and informed consent obtained. Details of the Procedure The patient underwent monitored anesthesia care, which was administered by an anesthesia professional. The patient's blood pressure, ECG, ETCO2, heart rate, level of consciousness, oxygen and respirations were monitored throughout the procedure. A digital rectal exam was performed. The scope was introduced through the anus and advanced to the terminal ileum. Retroflexion was performed in the rectum. The quality  of bowel preparation was evaluated using the [facility] Bowel Preparation Scale with scores of: right colon = 2, transverse colon = 3, left colon = 2. The total BBPS score was 7. Bowel prep was adequate. The patient's estimated blood loss was minimal. The procedure was not difficult. The patient tolerated the procedure well. There were no apparent adverse events. Events Procedure Events Event Event Time UPPER SCOPE IN TIME 08/23/2023  8:55 AM UPPER SCOPE OUT TIME 08/23/2023  9:06 AM LOWER SCOPE IN TIME 08/23/2023  9:17 AM ENDO CECUM REACHED 08/23/2023  9:19 AM LOWER SCOPE OUT TIME 08/23/2023  9:40 AM Findings Four sessile polyps measuring smaller than 5 mm in the ascending colon; performed cold snare with complete en bloc removal and retrieved specimen One sessile polyp measuring 5-9 mm in the ascending colon; performed cold snare with complete en bloc removal and retrieved specimen Internal medium hemorrhoids Performed multiple random forceps biopsies in the ascending colon and descending colon to rule out microscopic colitis -TI appeared normal for 5 cm Impression 5 polyps in the ascending colon were removed with cold snare Internal medium hemorrhoids Performed forceps biopsies in the ascending colon and descending colon to rule out microscopic colitis Recommendation Await pathology results Repeat colonoscopy in 3 years, due: 08/22/2026 High fiber diet   -Follow up with referring provider as needed Post Procedure Diagnosis None  Specimens ID Type Source Tests Collected by Time 1 : RO H PYLORI Tissue Stomach, Random TISSUE EXAM Hoy Earnie Amour, MD 08/23/[phone] 2 : multiple Tissue Colon Polyp, Right/Ascending TISSUE EXAM Hoy Earnie Amour, MD 08/23/2023 [phone] 3 : RO microscopic colitis Tissue Colon, Random TISSUE EXAM Hoy Earnie Amour, MD 08/23/2023 0930 Referring Provider Devona Lucie Sherrilyn, SQUIBB* Last Colonoscopy The patient doesn't have any registry metric data available   Result Date: 08/26/2023 Table formatting from  the original result was not included. Staff Role [place] E Harris, RN Endo Nurse Delon Westly Gola, CRNA CRNA Hoy Earnie Amour, MD Proceduralist Cena Hy, MD Anesthesiologist Indication Nausea, Generalized abdominal pain, Change in bowel habit  1st screening colonoscopy Medications See Anesthesia Record. Preprocedure A history and physical has been performed, and patient medication allergies have been reviewed. The patient's tolerance of previous anesthesia has been reviewed. The risks and benefits of the procedure and the sedation options and risks were discussed with the patient. All questions were answered and informed consent obtained. Details of the Procedure The patient underwent monitored anesthesia care,  which was administered by an anesthesia professional. The patient's blood pressure, ECG, ETCO2, heart rate, level of consciousness, oxygen and respirations were monitored throughout the procedure. A digital rectal exam was performed. The scope was introduced through the anus and advanced to the terminal ileum. Retroflexion was performed in the rectum. The quality of bowel preparation was evaluated using the [facility] Bowel Preparation Scale with scores of: right colon = 2, transverse colon = 3, left colon = 2. The total BBPS score was 7. Bowel prep was adequate. The patient's estimated blood loss was minimal. The procedure was not difficult. The patient tolerated the procedure well. There were no apparent adverse events. Events Procedure Events Event Event Time UPPER SCOPE IN TIME 08/23/2023  8:55 AM UPPER SCOPE OUT TIME 08/23/2023  9:06 AM LOWER SCOPE IN TIME 08/23/2023  9:17 AM ENDO CECUM REACHED 08/23/2023  9:19 AM LOWER SCOPE OUT TIME 08/23/2023  9:40 AM Findings Four sessile polyps measuring smaller than 5 mm in the ascending colon; performed cold snare with complete en bloc removal and retrieved specimen One sessile polyp measuring 5-9 mm in the ascending colon; performed cold snare with complete en  bloc removal and retrieved specimen Internal medium hemorrhoids Performed multiple random forceps biopsies in the ascending colon and descending colon to rule out microscopic colitis -TI appeared normal for 5 cm Impression 5 polyps in the ascending colon were removed with cold snare Internal medium hemorrhoids Performed forceps biopsies in the ascending colon and descending colon to rule out microscopic colitis Recommendation Await pathology results Repeat colonoscopy in 3 years, due: 08/22/2026 High fiber diet   Post Procedure Diagnosis None  Specimens ID Type Source Tests Collected by Time 1 : RO H PYLORI Tissue Stomach, Random TISSUE EXAM Hoy Earnie Amour, MD 08/23/2023 [phone] 2 : multiple Tissue Colon Polyp, Right/Ascending TISSUE EXAM Hoy Earnie Amour, MD 08/23/2023 [phone] 3 : RO microscopic colitis Tissue Colon, Random TISSUE EXAM Hoy Earnie Amour, MD 08/23/2023 0930 Referring Provider Devona Lucie Fees, SHAUNNA* Last Colonoscopy The patient doesn't have any registry metric data available   Endo EGD Addendum Date: 08/26/2023 Staff Role [place] E Harris, RN Endo Nurse Delon Westly Gola, CRNA CRNA Hoy Earnie Amour, MD Proceduralist Cena Hy, MD Anesthesiologist Indication Nausea, Generalized abdominal pain, Change in bowel habit, Gastroesophageal reflux disease, unspecified whether esophagitis present -GI clinic 03/30/23 with PA-C Hess: dyspepsia, nausea, change in bowel habits. + NSAID use (Goody powder, Aleve, meloxicam). Currently meloxicam daily for hand pain -Previously tried Protonix  without improvement. Not on PPI currently -CT A/P no acute abnormalities -Celiac negative Medications See Anesthesia Record. Preprocedure A history and physical has been performed, and patient medication allergies have been reviewed. The patient's tolerance of previous anesthesia has been reviewed. The risks and benefits of the procedure and the sedation options and risks were discussed with the patient. All  questions were answered and informed consent obtained. Details of the Procedure The patient underwent monitored anesthesia care, which was administered by an anesthesia professional. The patient's blood pressure, ECG, ETCO2, heart rate, level of consciousness, oxygen and respirations were monitored throughout the procedure. The scope was introduced through the mouth and advanced to the second part of the duodenum. Retroflexion was performed in the cardia, fundus and incisura. The patient's estimated blood loss was minimal. The procedure was not difficult. The patient tolerated the procedure well. There were no apparent adverse events. 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 -  Small 3mm gastric ulcer with clean base in the lesser curvature of the antrum. -Diffuse gastropathy throughout the stomach associated with erythema, friability, and free-floating heme -[place] protocol biopsies were obtained from the antrum/angularis and gastric body to evaluate for H pylori. -Patent pylorus  Duodenum: -Mild diffuse friability in D1 and D2; no erosions or ulcers. Impression -Small clean-based antral ulcer -Diffuse gastritis Etiology: may be NSAID-induced. Biopsies to rule out H pylori Recommendation Await pathology results Follow up with referring physician  -Stop NSAIDs (meloxicam) -Start omeprazole 40 once daily, continue until next endoscopy -Repeat EGD in 3 months to confirm gastric ulcer healing Pathology addendum: Final Diagnosis A.  Stomach, endoscopic biopsy: Gastric antral and oxyntic mucosa with features suggestive of reactive gastropathy. No active or chronic gastritis is seen. No evidence of H. pylori is seen.  Post-Op Diagnosis  None  Events Procedure Events Event Event Time UPPER SCOPE IN TIME 08/23/2023  8:55 AM UPPER SCOPE OUT TIME 08/23/2023  9:06 AM LOWER SCOPE IN TIME 08/23/2023  9:17 AM ENDO CECUM REACHED 08/23/2023   9:19 AM LOWER SCOPE OUT TIME 08/23/2023  9:40 AM Specimens ID Type Source Tests Collected by Time 1 : RO H PYLORI Tissue Stomach, Random TISSUE EXAM Hoy Earnie Amour, MD 08/23/[phone] 2 : multiple Tissue Colon Polyp, Right/Ascending TISSUE EXAM Hoy Earnie Amour, MD 08/23/2023 [phone] 3 : RO microscopic colitis Tissue Colon, Random TISSUE EXAM Hoy Earnie Amour, MD 08/23/2023 0930 Referring Provider Devona Lucie Fees, SHAUNNA*  Result Date: 08/26/2023 Table formatting from the original result was not included. Staff Role [place] E Harris, RN Endo Nurse Delon Westly Gola, CRNA CRNA Hoy Earnie Amour, MD Proceduralist Cena Hy, MD Anesthesiologist Indication Nausea, Generalized abdominal pain, Change in bowel habit, Gastroesophageal reflux disease, unspecified whether esophagitis present GI clinic 03/30/23 with PA-C Hess: dyspepsia, nausea, change in bowel habits. + NSAID use (Goody powder, Aleve, meloxicam). -Previously tried Protonix  without improvement. Not on PPI currently -CT A/P no acute abnormalities -Celiac negative Medications See Anesthesia Record. Preprocedure A history and physical has been performed, and patient medication allergies have been reviewed. The patient's tolerance of previous anesthesia has been reviewed. The risks and benefits of the procedure and the sedation options and risks were discussed with the patient. All questions were answered and informed consent obtained. Details of the Procedure The patient underwent monitored anesthesia care, which was administered by an anesthesia professional. The patient's blood pressure, ECG, ETCO2, heart rate, level of consciousness, oxygen and respirations were monitored throughout the procedure. The scope was introduced through the mouth and advanced to the second part of the duodenum. Retroflexion was performed in the cardia, fundus and incisura. The patient's estimated blood loss was minimal. The procedure was not difficult. The patient  tolerated the procedure well. There were no apparent adverse events. 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 -Small 3mm gastric ulcer with clean base in the lesser curvature of the antrum. -Diffuse gastropathy throughout the stomach associated with erythema, friability, and free-floating heme -[place] protocol biopsies were obtained from the antrum/angularis and gastric body to evaluate for H pylori. -Patent pylorus  Duodenum: -Mild diffuse friability in D1 and D2; no erosions or ulcers. Impression -Small clean-based antral ulcer -Diffuse gastritis Etiology: may be NSAID-induced. Biopsies to rule out H pylori Recommendation Await pathology results Follow up with referring physician  -Stop NSAIDs (meloxicam) -Start omeprazole 40 once daily until next endoscopy -Repeat EGD in 3 months  to confirm ulcer healing Post-Op Diagnosis  None  Events Procedure Events Event Event Time UPPER SCOPE IN TIME 08/23/2023  8:55 AM UPPER SCOPE OUT TIME 08/23/2023  9:06 AM LOWER SCOPE IN TIME 08/23/2023  9:17 AM ENDO CECUM REACHED 08/23/2023  9:19 AM LOWER SCOPE OUT TIME 08/23/2023  9:40 AM Specimens ID Type Source Tests Collected by Time 1 : RO H PYLORI Tissue Stomach, Random TISSUE EXAM Hoy Earnie Amour, MD 08/23/2023 [phone] 2 : multiple Tissue Colon Polyp, Right/Ascending TISSUE EXAM Hoy Earnie Amour, MD 08/23/2023 [phone] : RO microscopic colitis Tissue Colon, Random TISSUE EXAM Hoy Earnie Amour, MD 08/23/2023 0930 Referring Provider Devona Lucie Fees, SHAUNNA*    I personally reviewed patients records (including a review of problem list, current meds), labs, imaging and any consultant notes      Tawanna Sheen, MD [facility]-Infectious Disease- [facility] [phone]  This record has been created using Dragon voice recognition software.         [1]  Current Outpatient Medications:  .  acetaminophen  (TYLENOL )  500 mg tablet, Take 1,000 mg by mouth every 6 (six) hours as needed for mild pain (1-3)., Disp: , Rfl:  .  atorvastatin (LIPITOR) 40 mg tablet, TAKE 1 TABLET BY MOUTH EVERY DAY, Disp: 90 tablet, Rfl: 3 .  empagliflozin (Jardiance) 10 mg tab, TAKE 1 TABLET BY MOUTH EVERY DAY (Patient not taking: Reported on 09/09/2023), Disp: 30 tablet, Rfl: 11 .  Entresto  97-103 mg per tablet, TAKE 1 TABLET BY MOUTH TWICE A DAY, Disp: 180 tablet, Rfl: 3 .  gabapentin (NEURONTIN) 300 mg capsule, TAKE ONE PILL AT BREAKFAST, LUNCH AND DINNER. MAY CAUSE DROWSINESS, (Patient not taking: Reported on 09/09/2023), Disp: 90 capsule, Rfl: 0 .  meloxicam (MOBIC) 15 mg tablet, TAKE 1 TABLET (15 MG TOTAL) BY MOUTH DAILY. (Patient not taking: Reported on 09/09/2023), Disp: 30 tablet, Rfl: 0 .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

## 2023-09-26 DIAGNOSIS — M1812 Unilateral primary osteoarthritis of first carpometacarpal joint, left hand: Secondary | ICD-10-CM | POA: Diagnosis not present

## 2023-09-26 DIAGNOSIS — L03114 Cellulitis of left upper limb: Secondary | ICD-10-CM | POA: Diagnosis not present

## 2023-11-08 ENCOUNTER — Emergency Department (HOSPITAL_COMMUNITY)

## 2023-11-08 ENCOUNTER — Observation Stay (HOSPITAL_COMMUNITY)
Admission: EM | Admit: 2023-11-08 | Discharge: 2023-11-09 | Disposition: A | Discharge status: Home / Self Care | Attending: Cardiovascular Disease | Admitting: Cardiovascular Disease

## 2023-11-08 ENCOUNTER — Encounter (HOSPITAL_COMMUNITY): Payer: Self-pay

## 2023-11-08 ENCOUNTER — Observation Stay (HOSPITAL_COMMUNITY)

## 2023-11-08 ENCOUNTER — Other Ambulatory Visit: Payer: Self-pay

## 2023-11-08 DIAGNOSIS — E785 Hyperlipidemia, unspecified: Secondary | ICD-10-CM | POA: Insufficient documentation

## 2023-11-08 DIAGNOSIS — I5022 Chronic systolic (congestive) heart failure: Secondary | ICD-10-CM | POA: Diagnosis not present

## 2023-11-08 DIAGNOSIS — I249 Acute ischemic heart disease, unspecified: Principal | ICD-10-CM | POA: Insufficient documentation

## 2023-11-08 DIAGNOSIS — F12920 Cannabis use, unspecified with intoxication, uncomplicated: Secondary | ICD-10-CM | POA: Diagnosis not present

## 2023-11-08 DIAGNOSIS — I480 Paroxysmal atrial fibrillation: Secondary | ICD-10-CM | POA: Diagnosis not present

## 2023-11-08 DIAGNOSIS — Z23 Encounter for immunization: Secondary | ICD-10-CM | POA: Diagnosis not present

## 2023-11-08 DIAGNOSIS — I2510 Atherosclerotic heart disease of native coronary artery without angina pectoris: Secondary | ICD-10-CM | POA: Diagnosis not present

## 2023-11-08 DIAGNOSIS — F1010 Alcohol abuse, uncomplicated: Secondary | ICD-10-CM | POA: Diagnosis not present

## 2023-11-08 DIAGNOSIS — R7989 Other specified abnormal findings of blood chemistry: Secondary | ICD-10-CM | POA: Diagnosis not present

## 2023-11-08 DIAGNOSIS — R002 Palpitations: Secondary | ICD-10-CM | POA: Diagnosis present

## 2023-11-08 DIAGNOSIS — I110 Hypertensive heart disease with heart failure: Secondary | ICD-10-CM | POA: Diagnosis not present

## 2023-11-08 DIAGNOSIS — I4891 Unspecified atrial fibrillation: Principal | ICD-10-CM

## 2023-11-08 LAB — BASIC METABOLIC PANEL WITH GFR
Anion gap: 12 (ref 5–15)
BUN: 13 mg/dL (ref 6–20)
CO2: 21 mmol/L — ABNORMAL LOW (ref 22–32)
Calcium: 9.3 mg/dL (ref 8.9–10.3)
Chloride: 106 mmol/L (ref 98–111)
Creatinine, Ser: 1.01 mg/dL (ref 0.61–1.24)
GFR, Estimated: >60 mL/min (ref >60)
Glucose, Bld: 142 mg/dL — ABNORMAL HIGH (ref 70–99)
Potassium: 3.5 mmol/L (ref 3.5–5.1)
Sodium: 139 mmol/L (ref 135–145)

## 2023-11-08 LAB — CBC WITH DIFFERENTIAL/PLATELET
Abs Immature Granulocytes: 0.06 K/uL (ref 0.00–0.07)
Basophils Absolute: 0.1 K/uL (ref 0.0–0.1)
Basophils Relative: 0 %
Eosinophils Absolute: 0.2 K/uL (ref 0.0–0.5)
Eosinophils Relative: 1 %
HCT: 49.7 % (ref 39.0–52.0)
Hemoglobin: 16.8 g/dL (ref 13.0–17.0)
Immature Granulocytes: 0 %
Lymphocytes Relative: 21 %
Lymphs Abs: 3.8 K/uL (ref 0.7–4.0)
MCH: 32.8 pg (ref 26.0–34.0)
MCHC: 33.8 g/dL (ref 30.0–36.0)
MCV: 97.1 fL (ref 80.0–100.0)
Monocytes Absolute: 1.4 K/uL — ABNORMAL HIGH (ref 0.1–1.0)
Monocytes Relative: 8 %
Neutro Abs: 12.5 K/uL — ABNORMAL HIGH (ref 1.7–7.7)
Neutrophils Relative %: 70 %
Platelets: 360 K/uL (ref 150–400)
RBC: 5.12 MIL/uL (ref 4.22–5.81)
RDW: 12.3 % (ref 11.5–15.5)
WBC: 18 K/uL — ABNORMAL HIGH (ref 4.0–10.5)
nRBC: 0 % (ref 0.0–0.2)

## 2023-11-08 LAB — ECHOCARDIOGRAM COMPLETE
Area-P 1/2: 3.6 cm2
Calc EF: 53.6 %
Height: 71 in
S' Lateral: 3.8 cm
Single Plane A2C EF: 56.2 %
Single Plane A4C EF: 51.7 %

## 2023-11-08 LAB — TROPONIN I (HIGH SENSITIVITY)
Troponin I (High Sensitivity): 84 ng/L — ABNORMAL HIGH (ref <18)
Troponin I (High Sensitivity): 133 ng/L (ref <18)

## 2023-11-08 LAB — HIV ANTIBODY (ROUTINE TESTING W REFLEX): HIV Screen 4th Generation wRfx: NONREACTIVE

## 2023-11-08 LAB — DIGOXIN LEVEL: Digoxin Level: <0.6 ng/mL — ABNORMAL LOW (ref 0.8–2.0)

## 2023-11-08 LAB — MAGNESIUM: Magnesium: 1.6 mg/dL — ABNORMAL LOW (ref 1.7–2.4)

## 2023-11-08 MED ORDER — POTASSIUM CHLORIDE CRYS ER 10 MEQ PO TBCR
10.0000 meq | EXTENDED_RELEASE_TABLET | Freq: Every day | ORAL | Status: DC
  Administered 2023-11-08 – 2023-11-09 (×2): 10 meq via ORAL
  Filled 2023-11-08 (×2): qty 1

## 2023-11-08 MED ORDER — MAGNESIUM SULFATE 2 GM/50ML IV SOLN
2.0000 g | Freq: Once | INTRAVENOUS | Status: AC
  Administered 2023-11-08: 2 g via INTRAVENOUS
  Filled 2023-11-08: qty 50

## 2023-11-08 MED ORDER — PANTOPRAZOLE SODIUM 40 MG PO TBEC
40.0000 mg | DELAYED_RELEASE_TABLET | Freq: Every day | ORAL | Status: DC
Start: 2023-11-08 — End: 2023-11-10
  Administered 2023-11-08 – 2023-11-09 (×2): 40 mg via ORAL
  Filled 2023-11-08 (×2): qty 1

## 2023-11-08 MED ORDER — HYDROXYZINE HCL 25 MG PO TABS
25.0000 mg | ORAL_TABLET | Freq: Once | ORAL | Status: AC
  Administered 2023-11-08: 25 mg via ORAL
  Filled 2023-11-08: qty 1

## 2023-11-08 MED ORDER — APIXABAN 5 MG PO TABS
5.0000 mg | ORAL_TABLET | Freq: Two times a day (BID) | ORAL | Status: DC
  Administered 2023-11-09 (×2): 5 mg via ORAL
  Filled 2023-11-08 (×2): qty 1

## 2023-11-08 MED ORDER — LOPERAMIDE HCL 1 MG/7.5ML PO SUSP
1.0000 mg | Freq: Once | ORAL | Status: AC
  Administered 2023-11-08: 1 mg via ORAL
  Filled 2023-11-08: qty 7.5

## 2023-11-08 MED ORDER — EZETIMIBE 10 MG PO TABS: 10.0000 mg | ORAL_TABLET | Freq: Every day | ORAL | Status: DC

## 2023-11-08 MED ORDER — ONDANSETRON HCL 4 MG/2ML IJ SOLN: 4.0000 mg | Freq: Four times a day (QID) | INTRAMUSCULAR | Status: DC | PRN

## 2023-11-08 MED ORDER — DIGOXIN 125 MCG PO TABS: 0.1250 mg | ORAL_TABLET | Freq: Every day | ORAL | Status: DC

## 2023-11-08 MED ORDER — SACUBITRIL-VALSARTAN 24-26 MG PO TABS
1.0000 | ORAL_TABLET | Freq: Two times a day (BID) | ORAL | Status: DC
  Administered 2023-11-08 – 2023-11-09 (×3): 1 via ORAL
  Filled 2023-11-08 (×3): qty 1

## 2023-11-08 MED ORDER — ASPIRIN 81 MG PO TBEC
81.0000 mg | DELAYED_RELEASE_TABLET | Freq: Every day | ORAL | Status: DC
  Administered 2023-11-09: 81 mg via ORAL
  Filled 2023-11-08: qty 1

## 2023-11-08 MED ORDER — LORATADINE 10 MG PO TABS: 10.0000 mg | ORAL_TABLET | Freq: Every day | ORAL | Status: DC | PRN

## 2023-11-08 MED ORDER — SPIRONOLACTONE 12.5 MG HALF TABLET
12.5000 mg | ORAL_TABLET | Freq: Every day | ORAL | Status: DC
  Administered 2023-11-09: 12.5 mg via ORAL
  Filled 2023-11-08: qty 1

## 2023-11-08 MED ORDER — METOPROLOL SUCCINATE ER 25 MG PO TB24
100.0000 mg | ORAL_TABLET | Freq: Once | ORAL | Status: AC
  Administered 2023-11-08: 100 mg via ORAL
  Filled 2023-11-08: qty 4

## 2023-11-08 MED ORDER — SUCRALFATE 1 G PO TABS
1.0000 g | ORAL_TABLET | Freq: Four times a day (QID) | ORAL | Status: DC
  Administered 2023-11-08 – 2023-11-09 (×5): 1 g via ORAL
  Filled 2023-11-08 (×5): qty 1

## 2023-11-08 MED ORDER — NITROGLYCERIN 0.4 MG SL SUBL: 0.4000 mg | SUBLINGUAL_TABLET | SUBLINGUAL | Status: DC | PRN

## 2023-11-08 MED ORDER — ACETAMINOPHEN 325 MG PO TABS
650.0000 mg | ORAL_TABLET | ORAL | Status: DC | PRN
  Administered 2023-11-08 – 2023-11-09 (×2): 650 mg via ORAL
  Filled 2023-11-08 (×2): qty 2

## 2023-11-08 MED ORDER — METOPROLOL SUCCINATE ER 25 MG PO TB24
100.0000 mg | ORAL_TABLET | Freq: Every day | ORAL | Status: DC
  Administered 2023-11-09: 100 mg via ORAL
  Filled 2023-11-08: qty 4

## 2023-11-08 MED ORDER — METOPROLOL TARTRATE 5 MG/5ML IV SOLN
10.0000 mg | Freq: Once | INTRAVENOUS | Status: AC
  Administered 2023-11-08: 10 mg via INTRAVENOUS
  Filled 2023-11-08: qty 10

## 2023-11-08 MED ORDER — MELATONIN 3 MG PO TABS
3.0000 mg | ORAL_TABLET | Freq: Every day | ORAL | Status: DC
  Administered 2023-11-08: 3 mg via ORAL
  Filled 2023-11-08: qty 1

## 2023-11-08 MED ORDER — FUROSEMIDE 20 MG PO TABS: 40.0000 mg | ORAL_TABLET | Freq: Every day | ORAL | Status: DC

## 2023-11-08 NOTE — Progress Notes (Signed)
 Echocardiogram 2D Echocardiogram has been performed.  Juliene JINNY Rucks 11/08/2023, 2:32 PM

## 2023-11-08 NOTE — ED Notes (Signed)
 Called CCMD for patient

## 2023-11-08 NOTE — ED Notes (Signed)
 Floor notified patient coming upstairs.

## 2023-11-08 NOTE — ED Notes (Signed)
 Floor notified patient coming up
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
## 2023-11-08 NOTE — Plan of Care (Signed)
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
## 2023-11-08 NOTE — ED Notes (Addendum)
 Critical troponin level reported to Dr Garrick face to face. Dr Garrick verbalized understanding.

## 2023-11-09 ENCOUNTER — Observation Stay (HOSPITAL_COMMUNITY)

## 2023-11-09 ENCOUNTER — Other Ambulatory Visit (HOSPITAL_COMMUNITY): Payer: Self-pay

## 2023-11-09 LAB — BASIC METABOLIC PANEL WITH GFR
Anion gap: 10 (ref 5–15)
BUN: 13 mg/dL (ref 6–20)
CO2: 20 mmol/L — ABNORMAL LOW (ref 22–32)
Calcium: 9 mg/dL (ref 8.9–10.3)
Chloride: 109 mmol/L (ref 98–111)
Creatinine, Ser: 0.95 mg/dL (ref 0.61–1.24)
GFR, Estimated: >60 mL/min (ref >60)
Glucose, Bld: 93 mg/dL (ref 70–99)
Potassium: 3.9 mmol/L (ref 3.5–5.1)
Sodium: 139 mmol/L (ref 135–145)

## 2023-11-09 LAB — CBC
HCT: 43.8 % (ref 39.0–52.0)
Hemoglobin: 15.1 g/dL (ref 13.0–17.0)
MCH: 33.3 pg (ref 26.0–34.0)
MCHC: 34.5 g/dL (ref 30.0–36.0)
MCV: 96.5 fL (ref 80.0–100.0)
Platelets: 308 K/uL (ref 150–400)
RBC: 4.54 MIL/uL (ref 4.22–5.81)
RDW: 12.5 % (ref 11.5–15.5)
WBC: 11 K/uL — ABNORMAL HIGH (ref 4.0–10.5)
nRBC: 0 % (ref 0.0–0.2)

## 2023-11-09 LAB — LIPID PANEL
Cholesterol: 126 mg/dL (ref 0–200)
HDL: 34 mg/dL — ABNORMAL LOW (ref >40)
LDL Cholesterol: 60 mg/dL (ref 0–99)
Total CHOL/HDL Ratio: 3.7 ratio
Triglycerides: 160 mg/dL — ABNORMAL HIGH (ref <150)
VLDL: 32 mg/dL (ref 0–40)

## 2023-11-09 MED ORDER — TECHNETIUM TC 99M TETROFOSMIN IV KIT
10.0000 | PACK | Freq: Once | INTRAVENOUS | Status: AC
  Administered 2023-11-09: 9.5 via INTRAVENOUS

## 2023-11-09 MED ORDER — POTASSIUM CHLORIDE CRYS ER 10 MEQ PO TBCR: 10.0000 meq | EXTENDED_RELEASE_TABLET | Freq: Every day | ORAL | 3 refills | Status: AC

## 2023-11-09 MED ORDER — APIXABAN 5 MG PO TABS: 5.0000 mg | ORAL_TABLET | Freq: Two times a day (BID) | ORAL | 3 refills | Status: AC

## 2023-11-09 MED ORDER — REGADENOSON 0.4 MG/5ML IV SOLN
0.4000 mg | Freq: Once | INTRAVENOUS | Status: AC
  Administered 2023-11-09: 0.4 mg via INTRAVENOUS

## 2023-11-09 MED ORDER — TECHNETIUM TC 99M TETROFOSMIN IV KIT
30.0000 | PACK | Freq: Once | INTRAVENOUS | Status: AC
  Administered 2023-11-09: 32.3 via INTRAVENOUS

## 2023-11-09 MED ORDER — INFLUENZA VIRUS VACC SPLIT PF (FLUZONE) 0.5 ML IM SUSY
0.5000 mL | PREFILLED_SYRINGE | INTRAMUSCULAR | Status: AC
  Administered 2023-11-09: 0.5 mL via INTRAMUSCULAR
  Filled 2023-11-09: qty 0.5

## 2023-11-09 MED ORDER — REGADENOSON 0.4 MG/5ML IV SOLN
INTRAVENOUS | Status: AC
  Filled 2023-11-09: qty 5

## 2023-11-09 MED ORDER — SPIRONOLACTONE 25 MG PO TABS: 25.0000 mg | ORAL_TABLET | Freq: Every day | ORAL | 3 refills | Status: AC

## 2023-11-09 NOTE — TOC CM/SW Note (Signed)
 Transition of Care [facility]) - Inpatient Brief Assessment   Patient Details  Name: Samuel [name] MRN: [id number] Date of Birth: 1975-03-23  [facility] of [facility]) CM/SW Contact:    Sudie Erminio Deems, RN Phone Number: 11/09/2023, 3:30 PM   Clinical Narrative: Patient presented for chest pain-plan for stress test. PTA patient was from home alone. Patient states he has Medicaid and cost ranges; however, he states he can afford. Patient has PCP and has no issues with transportation. No home needs identified at this time.    Transition of Care Asessment: Insurance and Status: Insurance coverage has been reviewed Patient has primary care physician: Yes Home environment has been reviewed: reviewed Prior level of function:: independent Prior/Current Home Services: No current home services Social Drivers of Health Review: SDOH reviewed no interventions necessary Readmission risk has been reviewed: Yes Transition of care needs: no transition of care needs at this time
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
## 2023-11-10 LAB — LIPOPROTEIN A (LPA): Lipoprotein (a): 80.1 nmol/L — ABNORMAL HIGH (ref <75.0)
